# Patient Record
Sex: Male | Born: 1960 | Race: Black or African American | Hispanic: No | Marital: Single | Smoking: Never smoker
Health system: Southern US, Community
[De-identification: ages and names within clinical notes are randomized; demographics above are authoritative.]

## PROBLEM LIST (undated history)

## (undated) DIAGNOSIS — Z21 Asymptomatic human immunodeficiency virus [HIV] infection status: Secondary | ICD-10-CM

## (undated) DIAGNOSIS — B20 Human immunodeficiency virus [HIV] disease: Secondary | ICD-10-CM

## (undated) DIAGNOSIS — G4733 Obstructive sleep apnea (adult) (pediatric): Secondary | ICD-10-CM

## (undated) DIAGNOSIS — E119 Type 2 diabetes mellitus without complications: Secondary | ICD-10-CM

## (undated) DIAGNOSIS — I10 Essential (primary) hypertension: Secondary | ICD-10-CM

---

## 2011-09-15 DIAGNOSIS — G4700 Insomnia, unspecified: Secondary | ICD-10-CM

## 2011-09-15 HISTORY — DX: Insomnia, unspecified: G47.00

## 2013-03-25 DIAGNOSIS — G894 Chronic pain syndrome: Secondary | ICD-10-CM

## 2013-03-25 DIAGNOSIS — R809 Proteinuria, unspecified: Secondary | ICD-10-CM

## 2013-03-25 HISTORY — DX: Proteinuria, unspecified: R80.9

## 2014-08-29 ENCOUNTER — Emergency Department (HOSPITAL_BASED_OUTPATIENT_CLINIC_OR_DEPARTMENT_OTHER): Payer: Medicare Other

## 2014-08-29 ENCOUNTER — Emergency Department (HOSPITAL_BASED_OUTPATIENT_CLINIC_OR_DEPARTMENT_OTHER)
Admission: EM | Admit: 2014-08-29 | Discharge: 2014-08-29 | Disposition: A | Discharge status: Home / Self Care | Payer: Medicare Other | Attending: Emergency Medicine | Admitting: Emergency Medicine

## 2014-08-29 ENCOUNTER — Encounter (HOSPITAL_BASED_OUTPATIENT_CLINIC_OR_DEPARTMENT_OTHER): Payer: Self-pay | Admitting: *Deleted

## 2014-08-29 DIAGNOSIS — Z7951 Long term (current) use of inhaled steroids: Secondary | ICD-10-CM | POA: Diagnosis not present

## 2014-08-29 DIAGNOSIS — E119 Type 2 diabetes mellitus without complications: Secondary | ICD-10-CM | POA: Insufficient documentation

## 2014-08-29 DIAGNOSIS — Z79899 Other long term (current) drug therapy: Secondary | ICD-10-CM | POA: Diagnosis not present

## 2014-08-29 DIAGNOSIS — I10 Essential (primary) hypertension: Secondary | ICD-10-CM | POA: Diagnosis not present

## 2014-08-29 DIAGNOSIS — Z794 Long term (current) use of insulin: Secondary | ICD-10-CM | POA: Insufficient documentation

## 2014-08-29 DIAGNOSIS — J45901 Unspecified asthma with (acute) exacerbation: Secondary | ICD-10-CM | POA: Insufficient documentation

## 2014-08-29 DIAGNOSIS — B20 Human immunodeficiency virus [HIV] disease: Secondary | ICD-10-CM | POA: Diagnosis not present

## 2014-08-29 DIAGNOSIS — J40 Bronchitis, not specified as acute or chronic: Secondary | ICD-10-CM

## 2014-08-29 DIAGNOSIS — R05 Cough: Secondary | ICD-10-CM | POA: Diagnosis present

## 2014-08-29 HISTORY — DX: Human immunodeficiency virus (HIV) disease: B20

## 2014-08-29 HISTORY — DX: Asymptomatic human immunodeficiency virus (hiv) infection status: Z21

## 2014-08-29 HISTORY — DX: Type 2 diabetes mellitus without complications: E11.9

## 2014-08-29 HISTORY — DX: Essential (primary) hypertension: I10

## 2014-08-29 MED ORDER — HYDROCODONE-HOMATROPINE 5-1.5 MG/5ML PO SYRP: 5.0000 mL | ORAL_SOLUTION | Freq: Four times a day (QID) | ORAL | Status: DC | PRN

## 2014-08-29 MED ORDER — ALBUTEROL SULFATE (2.5 MG/3ML) 0.083% IN NEBU
2.5000 mg | INHALATION_SOLUTION | Freq: Once | RESPIRATORY_TRACT | Status: AC
  Administered 2014-08-29: 2.5 mg via RESPIRATORY_TRACT
  Filled 2014-08-29: qty 3

## 2014-08-29 MED ORDER — PREDNISONE 20 MG PO TABS: ORAL_TABLET | ORAL | Status: DC

## 2014-08-29 MED ORDER — IPRATROPIUM-ALBUTEROL 0.5-2.5 (3) MG/3ML IN SOLN
3.0000 mL | Freq: Once | RESPIRATORY_TRACT | Status: AC
  Administered 2014-08-29: 3 mL via RESPIRATORY_TRACT
  Filled 2014-08-29: qty 3

## 2014-08-29 NOTE — ED Provider Notes (Signed)
CSN: [id number]     Arrival date & time 08/29/14  0716 History   First MD Initiated Contact with Patient 08/29/14 [phone]     Chief Complaint  Patient presents with  . Cough     (Consider location/radiation/quality/duration/timing/severity/associated sxs/prior Treatment) HPI Comments: Patient presents with cough and cold symptoms. He has an underlying history of asthma. He has a 4 to five-day history of runny nose nasal congestion and cough. His cough is productive of yellow-brown sputum. He has pain in his chest from coughing. He has some increased shortness of breath and wheezing which is consistent with an asthma exacerbation. He denies any leg pain or swelling. He does have some subjective fevers. He denies any vomiting or diarrhea. He's been using his nebulizer treatments at home with some improvement of symptoms.  Patient is a 54 y.o. male presenting with cough.  Cough Associated symptoms: chest pain (with coughing), fever, myalgias, rhinorrhea, shortness of breath and wheezing   Associated symptoms: no chills, no diaphoresis, no headaches and no rash     Past Medical History  Diagnosis Date  . Hypertension   . Diabetes mellitus without complication   . HIV (human immunodeficiency virus infection)    History reviewed. No pertinent past surgical history. History reviewed. No pertinent family history. History  Substance Use Topics  . Smoking status: Never Smoker   . Smokeless tobacco: Not on file  . Alcohol Use: Not on file    Review of Systems  Constitutional: Positive for fever and fatigue. Negative for chills and diaphoresis.  HENT: Positive for congestion, postnasal drip and rhinorrhea. Negative for sneezing.   Eyes: Negative.   Respiratory: Positive for cough, shortness of breath and wheezing. Negative for chest tightness.   Cardiovascular: Positive for chest pain (with coughing). Negative for leg swelling.  Gastrointestinal: Negative for nausea, vomiting, abdominal pain,  diarrhea and blood in stool.  Genitourinary: Negative for frequency, hematuria, flank pain and difficulty urinating.  Musculoskeletal: Positive for myalgias. Negative for back pain and arthralgias.  Skin: Negative for rash.  Neurological: Negative for dizziness, speech difficulty, weakness, numbness and headaches.      Allergies  Review of patient's allergies indicates no known allergies.  Home Medications   Prior to Admission medications   Medication Sig Start Date End Date Taking? Authorizing Provider  albuterol (PROVENTIL HFA;VENTOLIN HFA) 108 (90 BASE) MCG/ACT inhaler Inhale into the lungs every 6 (six) hours as needed for wheezing or shortness of breath.   Yes Historical Provider, MD  albuterol (PROVENTIL) (5 MG/ML) 0.5% nebulizer solution Take 2.5 mg by nebulization every 6 (six) hours as needed for wheezing or shortness of breath.   Yes Historical Provider, MD  AMLODIPINE BESYLATE PO Take by mouth.   Yes Historical Provider, MD  budesonide-formoterol (SYMBICORT) 160-4.5 MCG/ACT inhaler Inhale 2 puffs into the lungs 2 (two) times daily.   Yes Historical Provider, MD  efavirenz-emtricitabine-tenofovir (ATRIPLA) 600-200-300 MG per tablet Take 1 tablet by mouth at bedtime.   Yes Historical Provider, MD  fluticasone (FLONASE) 50 MCG/ACT nasal spray Place into both nostrils daily.   Yes Historical Provider, MD  GABAPENTIN, ONCE-DAILY, PO Take by mouth.   Yes Historical Provider, MD  HYDROCHLOROTHIAZIDE PO Take by mouth.   Yes Historical Provider, MD  insulin glargine (LANTUS) 100 UNIT/ML injection Inject 62 Units into the skin at bedtime.   Yes Historical Provider, MD  Valsartan (DIOVAN PO) Take by mouth.   Yes Historical Provider, MD  HYDROcodone-homatropine (HYCODAN) 5-1.5 MG/5ML syrup Take 5 mLs  by mouth every 6 (six) hours as needed for cough. 08/29/14   [name], MD  predniSONE (DELTASONE) 20 MG tablet 3 tabs po day one, then 2 po daily x 4 days 08/29/14   [name], MD   BP  160/94 mmHg  Pulse 112  Temp(Src) 99.1 F (37.3 C) (Oral)  Resp 24  Ht  (1.727 m)  Wt 230 lb (104.327 kg)  BMI 34.98 kg/m2  SpO2 95% Physical Exam  Constitutional: He is oriented to person, place, and time. He appears well-developed and well-nourished.  HENT:  Head: Normocephalic and atraumatic.  Eyes: Pupils are equal, round, and reactive to light.  Neck: Normal range of motion. Neck supple.  Cardiovascular: Normal rate, regular rhythm and normal heart sounds.   Pulmonary/Chest: Effort normal. No respiratory distress. He has wheezes. He has no rales. He exhibits no tenderness.  Abdominal: Soft. Bowel sounds are normal. There is no tenderness. There is no rebound and no guarding.  Musculoskeletal: Normal range of motion. He exhibits no edema.  No calf tenderness  Lymphadenopathy:    He has no cervical adenopathy.  Neurological: He is alert and oriented to person, place, and time.  Skin: Skin is warm and dry. No rash noted.  Psychiatric: He has a normal mood and affect.    ED Course  Procedures (including critical care time) Labs Review Labs Reviewed - No data to display  Imaging Review Dg Chest 2 View  08/29/2014   CLINICAL DATA:  Cough and congestion  EXAM: CHEST  2 VIEW  COMPARISON:  04/13/2012  FINDINGS: The heart size and mediastinal contours are within normal limits. Both lungs are clear. The visualized skeletal structures are unremarkable. Calcified granuloma is noted in the left mid lung.  IMPRESSION: No active cardiopulmonary disease.   Electronically Signed   By: Alcide Clever M.D.   On: 08/29/2014 07:59     EKG Interpretation None      MDM   Final diagnoses:  Bronchitis    Patient presents with cough and cold symptoms. He has an underlying history of asthma. He has some mild wheezing on exam but no increased work of breathing. His oxygen saturations are normal on room air. His chest x-ray does not show pneumonia. He has no suggestions of pulmonary  embolus. He was discharged home in good condition after receiving a nebulizer treatment here in the ED. He was discharged with a prescription for prednisone dose pack. He was also given a prescription for Hycodan cough syrup. He was advised to follow-up with his primary care physician if his symptoms are not improving.    [name], MD 08/29/14 [phone]

## 2014-08-29 NOTE — Discharge Instructions (Signed)
Upper Respiratory Infection, Adult An upper respiratory infection (URI) is also sometimes known as the common cold. The upper respiratory tract includes the nose, sinuses, throat, trachea, and bronchi. Bronchi are the airways leading to the lungs. Most people improve within 1 week, but symptoms can last up to 2 weeks. A residual cough may last even longer.  CAUSES Many different viruses can infect the tissues lining the upper respiratory tract. The tissues become irritated and inflamed and often become very moist. Mucus production is also common. A cold is contagious. You can easily spread the virus to others by oral contact. This includes kissing, sharing a glass, coughing, or sneezing. Touching your mouth or nose and then touching a surface, which is then touched by another person, can also spread the virus. SYMPTOMS  Symptoms typically develop 1 to 3 days after you come in contact with a cold virus. Symptoms vary from person to person. They may include:  Runny nose.  Sneezing.  Nasal congestion.  Sinus irritation.  Sore throat.  Loss of voice (laryngitis).  Cough.  Fatigue.  Muscle aches.  Loss of appetite.  Headache.  Low-grade fever. DIAGNOSIS  You might diagnose your own cold based on familiar symptoms, since most people get a cold 2 to 3 times a year. Your caregiver can confirm this based on your exam. Most importantly, your caregiver can check that your symptoms are not due to another disease such as strep throat, sinusitis, pneumonia, asthma, or epiglottitis. Blood tests, throat tests, and X-rays are not necessary to diagnose a common cold, but they may sometimes be helpful in excluding other more serious diseases. Your caregiver will decide if any further tests are required. RISKS AND COMPLICATIONS  You may be at risk for a more severe case of the common cold if you smoke cigarettes, have chronic heart disease (such as heart failure) or lung disease (such as asthma), or if  you have a weakened immune system. The very young and very old are also at risk for more serious infections. Bacterial sinusitis, middle ear infections, and bacterial pneumonia can complicate the common cold. The common cold can worsen asthma and chronic obstructive pulmonary disease (COPD). Sometimes, these complications can require emergency medical care and may be life-threatening. PREVENTION  The best way to protect against getting a cold is to practice good hygiene. Avoid oral or hand contact with people with cold symptoms. Wash your hands often if contact occurs. There is no clear evidence that vitamin C, vitamin E, echinacea, or exercise reduces the chance of developing a cold. However, it is always recommended to get plenty of rest and practice good nutrition. TREATMENT  Treatment is directed at relieving symptoms. There is no cure. Antibiotics are not effective, because the infection is caused by a virus, not by bacteria. Treatment may include:  Increased fluid intake. Sports drinks offer valuable electrolytes, sugars, and fluids.  Breathing heated mist or steam (vaporizer or shower).  Eating chicken soup or other clear broths, and maintaining good nutrition.  Getting plenty of rest.  Using gargles or lozenges for comfort.  Controlling fevers with ibuprofen or acetaminophen as directed by your caregiver.  Increasing usage of your inhaler if you have asthma. Zinc gel and zinc lozenges, taken in the first 24 hours of the common cold, can shorten the duration and lessen the severity of symptoms. Pain medicines may help with fever, muscle aches, and throat pain. A variety of non-prescription medicines are available to treat congestion and runny nose. Your caregiver   can make recommendations and may suggest nasal or lung inhalers for other symptoms.  HOME CARE INSTRUCTIONS   Only take over-the-counter or prescription medicines for pain, discomfort, or fever as directed by your  caregiver.  Use a warm mist humidifier or inhale steam from a shower to increase air moisture. This may keep secretions moist and make it easier to breathe.  Drink enough water and fluids to keep your urine clear or pale yellow.  Rest as needed.  Return to work when your temperature has returned to normal or as your caregiver advises. You may need to stay home longer to avoid infecting others. You can also use a face mask and careful hand washing to prevent spread of the virus. SEEK MEDICAL CARE IF:   After the first few days, you feel you are getting worse rather than better.  You need your caregiver's advice about medicines to control symptoms.  You develop chills, worsening shortness of breath, or brown or red sputum. These may be signs of pneumonia.  You develop yellow or brown nasal discharge or pain in the face, especially when you bend forward. These may be signs of sinusitis.  You develop a fever, swollen neck glands, pain with swallowing, or white areas in the back of your throat. These may be signs of strep throat. SEEK IMMEDIATE MEDICAL CARE IF:   You have a fever.  You develop severe or persistent headache, ear pain, sinus pain, or chest pain.  You develop wheezing, a prolonged cough, cough up blood, or have a change in your usual mucus (if you have chronic lung disease).  You develop sore muscles or a stiff neck. Document Released: 11/16/2000 Document Revised: 08/15/2011 Document Reviewed: 08/28/2013 Exit[employer] Patient Information 2015 [employer]. This information is not intended to replace advice given to you by your health care provider. Make sure you discuss any questions you have with your health care provider.  

## 2014-08-29 NOTE — ED Notes (Signed)
Pt amb to room 2 with quick steady gait in nad. Pt reports cough producing dark brown/yellow sputum, fevers up to 100.4. Pt has not taken any otc meds, but has been using his inhaler.

## 2014-09-30 ENCOUNTER — Emergency Department (HOSPITAL_BASED_OUTPATIENT_CLINIC_OR_DEPARTMENT_OTHER)
Admission: EM | Admit: 2014-09-30 | Discharge: 2014-09-30 | Disposition: A | Discharge status: Home / Self Care | Payer: Medicare Other | Attending: Emergency Medicine | Admitting: Emergency Medicine

## 2014-09-30 ENCOUNTER — Encounter (HOSPITAL_BASED_OUTPATIENT_CLINIC_OR_DEPARTMENT_OTHER): Payer: Self-pay | Admitting: *Deleted

## 2014-09-30 DIAGNOSIS — Z7952 Long term (current) use of systemic steroids: Secondary | ICD-10-CM | POA: Diagnosis not present

## 2014-09-30 DIAGNOSIS — Z794 Long term (current) use of insulin: Secondary | ICD-10-CM | POA: Insufficient documentation

## 2014-09-30 DIAGNOSIS — E119 Type 2 diabetes mellitus without complications: Secondary | ICD-10-CM | POA: Diagnosis not present

## 2014-09-30 DIAGNOSIS — Z21 Asymptomatic human immunodeficiency virus [HIV] infection status: Secondary | ICD-10-CM | POA: Diagnosis not present

## 2014-09-30 DIAGNOSIS — I10 Essential (primary) hypertension: Secondary | ICD-10-CM | POA: Diagnosis not present

## 2014-09-30 DIAGNOSIS — Z79899 Other long term (current) drug therapy: Secondary | ICD-10-CM | POA: Insufficient documentation

## 2014-09-30 DIAGNOSIS — Z7951 Long term (current) use of inhaled steroids: Secondary | ICD-10-CM | POA: Insufficient documentation

## 2014-09-30 DIAGNOSIS — M79602 Pain in left arm: Secondary | ICD-10-CM

## 2014-09-30 DIAGNOSIS — M79622 Pain in left upper arm: Secondary | ICD-10-CM | POA: Insufficient documentation

## 2014-09-30 MED ORDER — OXYCODONE-ACETAMINOPHEN 5-325 MG PO TABS
1.0000 | ORAL_TABLET | Freq: Once | ORAL | Status: AC
  Administered 2014-09-30: 1 via ORAL
  Filled 2014-09-30: qty 1

## 2014-09-30 MED ORDER — TRAMADOL HCL 50 MG PO TABS: 50.0000 mg | ORAL_TABLET | Freq: Four times a day (QID) | ORAL | Status: AC | PRN

## 2014-09-30 NOTE — ED Notes (Signed)
Left arm pain for 2 weeks. Diabetic. Denies chest pain.

## 2014-09-30 NOTE — ED Notes (Signed)
Patient walked to room 9 and given a gown.

## 2014-09-30 NOTE — Discharge Instructions (Signed)
Pain of Unknown Etiology (Pain Without a Known Cause) °You have come to your caregiver because of pain. Pain can occur in any part of the body. Often there is not a definite cause. If your laboratory (blood or urine) work was normal and X-rays or other studies were normal, your caregiver may treat you without knowing the cause of the pain. An example of this is the headache. Most headaches are diagnosed by taking a history. This means your caregiver asks you questions about your headaches. Your caregiver determines a treatment based on your answers. Usually testing done for headaches is normal. Often testing is not done unless there is no response to medications. Regardless of where your pain is located today, you can be given medications to make you comfortable. If no physical cause of pain can be found, most cases of pain will gradually leave as suddenly as they came.  °If you have a painful condition and no reason can be found for the pain, it is important that you follow up with your caregiver. If the pain becomes worse or does not go away, it may be necessary to repeat tests and look further for a possible cause. °· Only take over-the-counter or prescription medicines for pain, discomfort, or fever as directed by your caregiver. °· For the protection of your privacy, test results cannot be given over the phone. Make sure you receive the results of your test. Ask how these results are to be obtained if you have not been informed. It is your responsibility to obtain your test results. °· You may continue all activities unless the activities cause more pain. When the pain lessens, it is important to gradually resume normal activities. Resume activities by beginning slowly and gradually increasing the intensity and duration of the activities or exercise. During periods of severe pain, bed rest may be helpful. Lie or sit in any position that is comfortable. °· Ice used for acute (sudden) conditions may be effective.  Use a large plastic bag filled with ice and wrapped in a towel. This may provide pain relief. °· See your caregiver for continued problems. Your caregiver can help or refer you for exercises or physical therapy if necessary. °If you were given medications for your condition, do not drive, operate machinery or power tools, or sign legal documents for 24 hours. Do not drink alcohol, take sleeping pills, or take other medications that may interfere with treatment. °See your caregiver immediately if you have pain that is becoming worse and not relieved by medications. °Document Released: 02/15/2001 Document Revised: 03/13/2013 Document Reviewed: 05/23/2005 °[employer]® Patient Information ©2015 [employer]. This information is not intended to replace advice given to you by your health care provider. Make sure you discuss any questions you have with your health care provider. ° °

## 2014-09-30 NOTE — ED Provider Notes (Signed)
CSN: [id number]     Arrival date & time 09/30/14  1737   This chart was scribed for Larry Core, MD by Phillis Haggis, ED Scribe. This patient was seen in room MH09/MH09 and patient care was started at 6:39 PM.     Chief Complaint  Patient presents with  . Arm Pain   Patient is a 54 y.o. male presenting with arm pain. The history is provided by the patient. No language interpreter was used.  Arm Pain    HPI Comments: Larry Dickson is a 54 y.o. male with a history of HIV and Diabetes who presents to the Emergency Department complaining of left arm pain onset two weeks ago. He states that he switched medication for his diabetes because his sugar was out of control and states that the symptoms started 2 days after switching from Metformin. He reports similar symptoms when taking the Metformin, but states that the problems were not as severe before. He reports that the pain is constant throughout the day and feels like "someone broke his arm"," like a throbbing pain. He reports that the pain is centralized to the upper left arm. He states that he has taken naproxen with his other medications to no relief. He denies fever or chills. He reports he had blood work done two weeks ago.   Past Medical History  Diagnosis Date  . Hypertension   . Diabetes mellitus without complication   . HIV (human immunodeficiency virus infection)    History reviewed. No pertinent past surgical history. No family history on file. History  Substance Use Topics  . Smoking status: Never Smoker   . Smokeless tobacco: Not on file  . Alcohol Use: No    Review of Systems  Constitutional: Negative for fever and chills.  Musculoskeletal: Positive for myalgias.  All other systems reviewed and are negative.  Allergies  Review of patient's allergies indicates no known allergies.  Home Medications   Prior to Admission medications   Medication Sig Start Date End Date Taking? Authorizing Provider  NAPROXEN PO  Take by mouth.   Yes Historical Provider, MD  SitaGLIPtin-MetFORMIN HCl (JANUMET PO) Take by mouth.   Yes Historical Provider, MD  albuterol (PROVENTIL HFA;VENTOLIN HFA) 108 (90 BASE) MCG/ACT inhaler Inhale into the lungs every 6 (six) hours as needed for wheezing or shortness of breath.    Historical Provider, MD  albuterol (PROVENTIL) (5 MG/ML) 0.5% nebulizer solution Take 2.5 mg by nebulization every 6 (six) hours as needed for wheezing or shortness of breath.    Historical Provider, MD  AMLODIPINE BESYLATE PO Take by mouth.    Historical Provider, MD  budesonide-formoterol (SYMBICORT) 160-4.5 MCG/ACT inhaler Inhale 2 puffs into the lungs 2 (two) times daily.    Historical Provider, MD  efavirenz-emtricitabine-tenofovir (ATRIPLA) 600-200-300 MG per tablet Take 1 tablet by mouth at bedtime.    Historical Provider, MD  fluticasone (FLONASE) 50 MCG/ACT nasal spray Place into both nostrils daily.    Historical Provider, MD  GABAPENTIN, ONCE-DAILY, PO Take by mouth.    Historical Provider, MD  HYDROCHLOROTHIAZIDE PO Take by mouth.    Historical Provider, MD  HYDROcodone-homatropine (HYCODAN) 5-1.5 MG/5ML syrup Take 5 mLs by mouth every 6 (six) hours as needed for cough. 08/29/14   [name], MD  insulin glargine (LANTUS) 100 UNIT/ML injection Inject 62 Units into the skin at bedtime.    Historical Provider, MD  predniSONE (DELTASONE) 20 MG tablet 3 tabs po day one, then 2 po daily x 4  days 08/29/14   Rolan BuccoMelanie Belfi, MD  Valsartan (DIOVAN PO) Take by mouth.    Historical Provider, MD   BP 145/88 mmHg  Pulse 99  Temp(Src) 98.8 F (37.1 C) (Oral)  Resp 18  Ht 5\' 8"  (1.727 m)  Wt 232 lb (105.235 kg)  BMI 35.28 kg/m2  SpO2 95%  Physical Exam  Constitutional: He appears well-developed and well-nourished.  HENT:  Head: Normocephalic and atraumatic.  Eyes: Conjunctivae are normal.  Neck: Neck supple.  Cardiovascular: Normal rate and regular rhythm.   Radial pulses intact; good capillary refill   Pulmonary/Chest: Effort normal and breath sounds normal. No respiratory distress.  Abdominal: Soft. He exhibits no distension.  Musculoskeletal: He exhibits no edema or tenderness.  Mild tenderness to left trapezius; good ROM of the arm;   Neurological: He is alert.  Skin: Skin is warm and dry.   no skin changes;  Psychiatric: He has a normal mood and affect. His behavior is normal. Thought content normal.  Nursing note and vitals reviewed.   ED Course  Procedures (including critical care time)  6:43 PM-Discussed treatment plan which includes pain medication with pt at bedside and pt agreed to plan.   Labs Review Labs Reviewed - No data to display  Imaging Review No results found.   EKG Interpretation None      MDM   Final diagnoses:  None    Patient with left arm pain. Had some wall he was on metformin but increased when Janumet was started. Nonfocal exam. Doubt clot. Will discharge home to follow-up with his doctor tomorrow as planned. Some pain medicines given. I personally performed the services described in this documentation, which was scribed in my presence. The recorded information has been reviewed and is accurate.       Larry CoreNathan Tazaria Dlugosz, MD 10/01/14 [phone]

## 2014-12-06 ENCOUNTER — Encounter (HOSPITAL_BASED_OUTPATIENT_CLINIC_OR_DEPARTMENT_OTHER): Payer: Self-pay | Admitting: *Deleted

## 2014-12-06 ENCOUNTER — Emergency Department (HOSPITAL_BASED_OUTPATIENT_CLINIC_OR_DEPARTMENT_OTHER)
Admission: EM | Admit: 2014-12-06 | Discharge: 2014-12-06 | Disposition: A | Discharge status: Home / Self Care | Payer: Medicare Other | Attending: Emergency Medicine | Admitting: Emergency Medicine

## 2014-12-06 DIAGNOSIS — Z21 Asymptomatic human immunodeficiency virus [HIV] infection status: Secondary | ICD-10-CM | POA: Insufficient documentation

## 2014-12-06 DIAGNOSIS — Z79899 Other long term (current) drug therapy: Secondary | ICD-10-CM | POA: Diagnosis not present

## 2014-12-06 DIAGNOSIS — H66002 Acute suppurative otitis media without spontaneous rupture of ear drum, left ear: Secondary | ICD-10-CM | POA: Diagnosis not present

## 2014-12-06 DIAGNOSIS — I10 Essential (primary) hypertension: Secondary | ICD-10-CM | POA: Insufficient documentation

## 2014-12-06 DIAGNOSIS — Z794 Long term (current) use of insulin: Secondary | ICD-10-CM | POA: Diagnosis not present

## 2014-12-06 DIAGNOSIS — E119 Type 2 diabetes mellitus without complications: Secondary | ICD-10-CM | POA: Insufficient documentation

## 2014-12-06 DIAGNOSIS — Z7951 Long term (current) use of inhaled steroids: Secondary | ICD-10-CM | POA: Diagnosis not present

## 2014-12-06 DIAGNOSIS — H9202 Otalgia, left ear: Secondary | ICD-10-CM | POA: Diagnosis present

## 2014-12-06 MED ORDER — AMOXICILLIN 500 MG PO CAPS
1000.0000 mg | ORAL_CAPSULE | Freq: Once | ORAL | Status: AC
  Administered 2014-12-06: 1000 mg via ORAL
  Filled 2014-12-06: qty 2

## 2014-12-06 MED ORDER — AMOXICILLIN 500 MG PO CAPS: 1000.0000 mg | ORAL_CAPSULE | Freq: Three times a day (TID) | ORAL | Status: DC

## 2014-12-06 NOTE — ED Provider Notes (Signed)
CSN: [id number]     Arrival date & time 12/06/14  0510 History   First MD Initiated Contact with Patient 12/06/14 [phone]     Chief Complaint  Patient presents with  . Ear Pain      (Consider location/radiation/quality/duration/timing/severity/associated sxs/prior Treatment) HPI  This is a 54 year old male with a history of diabetes and HIV disease. He states his HIV count is undetectable. He is compliant with his medications. He is here with a two-day history of decreased hearing, a sensation of "stuffiness" and mild pain in his left ear. He denies fever or cold symptoms. There are no specific modifying factors. There is been no drainage from his left ear.  Past Medical History  Diagnosis Date  . Hypertension   . Diabetes mellitus without complication   . HIV (human immunodeficiency virus infection)    History reviewed. No pertinent past surgical history. No family history on file. History  Substance Use Topics  . Smoking status: Never Smoker   . Smokeless tobacco: Not on file  . Alcohol Use: No    Review of Systems  All other systems reviewed and are negative.   Allergies  Review of patient's allergies indicates no known allergies.  Home Medications   Prior to Admission medications   Medication Sig Start Date End Date Taking? Authorizing Provider  albuterol (PROVENTIL HFA;VENTOLIN HFA) 108 (90 BASE) MCG/ACT inhaler Inhale into the lungs every 6 (six) hours as needed for wheezing or shortness of breath.    Historical Provider, MD  albuterol (PROVENTIL) (5 MG/ML) 0.5% nebulizer solution Take 2.5 mg by nebulization every 6 (six) hours as needed for wheezing or shortness of breath.    Historical Provider, MD  AMLODIPINE BESYLATE PO Take by mouth.    Historical Provider, MD  budesonide-formoterol (SYMBICORT) 160-4.5 MCG/ACT inhaler Inhale 2 puffs into the lungs 2 (two) times daily.    Historical Provider, MD  efavirenz-emtricitabine-tenofovir (ATRIPLA) 600-200-300 MG per tablet  Take 1 tablet by mouth at bedtime.    Historical Provider, MD  fluticasone (FLONASE) 50 MCG/ACT nasal spray Place into both nostrils daily.    Historical Provider, MD  GABAPENTIN, ONCE-DAILY, PO Take by mouth.    Historical Provider, MD  HYDROCHLOROTHIAZIDE PO Take by mouth.    Historical Provider, MD  HYDROcodone-homatropine (HYCODAN) 5-1.5 MG/5ML syrup Take 5 mLs by mouth every 6 (six) hours as needed for cough. 08/29/14   Rolan BuccoMelanie Belfi, MD  insulin glargine (LANTUS) 100 UNIT/ML injection Inject 62 Units into the skin at bedtime.    Historical Provider, MD  NAPROXEN PO Take by mouth.    Historical Provider, MD  predniSONE (DELTASONE) 20 MG tablet 3 tabs po day one, then 2 po daily x 4 days 08/29/14   Rolan BuccoMelanie Belfi, MD  SitaGLIPtin-MetFORMIN HCl (JANUMET PO) Take by mouth.    Historical Provider, MD  traMADol (ULTRAM) 50 MG tablet Take 1 tablet (50 mg total) by mouth every 6 (six) hours as needed. 09/30/14   Benjiman CoreNathan Pickering, MD  Valsartan (DIOVAN PO) Take by mouth.    Historical Provider, MD   BP 147/95 mmHg  Pulse 91  Temp(Src) 97.7 F (36.5 C) (Oral)  Resp 18  Ht 5\' 8"  (1.727 m)  Wt 230 lb (104.327 kg)  BMI 34.98 kg/m2  SpO2 98%   Physical Exam  General: Well-developed, well-nourished male in no acute distress; appearance consistent with age of record HENT: normocephalic; atraumatic; right TM normal; left TM with purulent effusion but no erythema or rupture Eyes: pupils equal, round  and reactive to light; extraocular muscles intact Neck: supple; no lymphadenopathy  Heart: regular rate and rhythm Lungs: clear to auscultation bilaterally Abdomen: soft; nondistended; nontender; no masses or hepatosplenomegaly; bowel sounds present Extremities: No deformity; full range of motion; pulses normal Neurologic: Awake, alert and oriented; motor function intact in all extremities and symmetric; no facial droop Skin: Warm and dry Psychiatric: Normal mood and affect    ED Course  Procedures  (including critical care time)   MDM    Paula Libra, MD 12/06/14 0530

## 2014-12-06 NOTE — ED Notes (Signed)
Pt c/o left ear pain and difficulty hearing x2 days.

## 2014-12-06 NOTE — ED Notes (Signed)
MD at bedside. 

## 2018-03-18 ENCOUNTER — Encounter (HOSPITAL_BASED_OUTPATIENT_CLINIC_OR_DEPARTMENT_OTHER): Payer: Self-pay | Admitting: Adult Health

## 2018-03-18 ENCOUNTER — Emergency Department (HOSPITAL_BASED_OUTPATIENT_CLINIC_OR_DEPARTMENT_OTHER): Payer: Medicare Other

## 2018-03-18 ENCOUNTER — Other Ambulatory Visit: Payer: Self-pay

## 2018-03-18 ENCOUNTER — Emergency Department (HOSPITAL_BASED_OUTPATIENT_CLINIC_OR_DEPARTMENT_OTHER)
Admission: EM | Admit: 2018-03-18 | Discharge: 2018-03-18 | Disposition: A | Discharge status: Home / Self Care | Payer: Medicare Other | Attending: Emergency Medicine | Admitting: Emergency Medicine

## 2018-03-18 DIAGNOSIS — E119 Type 2 diabetes mellitus without complications: Secondary | ICD-10-CM | POA: Diagnosis not present

## 2018-03-18 DIAGNOSIS — J189 Pneumonia, unspecified organism: Secondary | ICD-10-CM

## 2018-03-18 DIAGNOSIS — I10 Essential (primary) hypertension: Secondary | ICD-10-CM | POA: Insufficient documentation

## 2018-03-18 DIAGNOSIS — Z21 Asymptomatic human immunodeficiency virus [HIV] infection status: Secondary | ICD-10-CM | POA: Insufficient documentation

## 2018-03-18 DIAGNOSIS — Z794 Long term (current) use of insulin: Secondary | ICD-10-CM | POA: Insufficient documentation

## 2018-03-18 DIAGNOSIS — R05 Cough: Secondary | ICD-10-CM | POA: Diagnosis present

## 2018-03-18 DIAGNOSIS — E876 Hypokalemia: Secondary | ICD-10-CM | POA: Diagnosis not present

## 2018-03-18 DIAGNOSIS — Z79899 Other long term (current) drug therapy: Secondary | ICD-10-CM | POA: Insufficient documentation

## 2018-03-18 LAB — CBC WITH DIFFERENTIAL/PLATELET
Abs Immature Granulocytes: 0.06 10*3/uL (ref 0.00–0.07)
BASOS ABS: 0 10*3/uL (ref 0.0–0.1)
Basophils Relative: 0 %
EOS ABS: 0.6 10*3/uL — AB (ref 0.0–0.5)
Eosinophils Relative: 4 %
HEMATOCRIT: 50.1 % (ref 39.0–52.0)
Hemoglobin: 16.2 g/dL (ref 13.0–17.0)
IMMATURE GRANULOCYTES: 0 %
LYMPHS ABS: 3.3 10*3/uL (ref 0.7–4.0)
Lymphocytes Relative: 24 %
MCH: 28.8 pg (ref 26.0–34.0)
MCHC: 32.3 g/dL (ref 30.0–36.0)
MCV: 89 fL (ref 80.0–100.0)
Monocytes Absolute: 1 10*3/uL (ref 0.1–1.0)
Monocytes Relative: 7 %
NEUTROS PCT: 65 %
NRBC: 0 % (ref 0.0–0.2)
Neutro Abs: 8.8 10*3/uL — ABNORMAL HIGH (ref 1.7–7.7)
PLATELETS: 292 10*3/uL (ref 150–400)
RBC: 5.63 MIL/uL (ref 4.22–5.81)
RDW: 12.8 % (ref 11.5–15.5)
WBC: 13.7 10*3/uL — AB (ref 4.0–10.5)

## 2018-03-18 LAB — BASIC METABOLIC PANEL
ANION GAP: 11 (ref 5–15)
BUN: 10 mg/dL (ref 6–20)
CO2: 29 mmol/L (ref 22–32)
Calcium: 9.5 mg/dL (ref 8.9–10.3)
Chloride: 96 mmol/L — ABNORMAL LOW (ref 98–111)
Creatinine, Ser: 1.12 mg/dL (ref 0.61–1.24)
GFR calc Af Amer: >60 mL/min (ref >60)
GFR calc non Af Amer: >60 mL/min (ref >60)
Glucose, Bld: 118 mg/dL — ABNORMAL HIGH (ref 70–99)
Potassium: 3.2 mmol/L — ABNORMAL LOW (ref 3.5–5.1)
SODIUM: 136 mmol/L (ref 135–145)

## 2018-03-18 LAB — CBG MONITORING, ED: Glucose-Capillary: 155 mg/dL — ABNORMAL HIGH (ref 70–99)

## 2018-03-18 MED ORDER — POTASSIUM CHLORIDE CRYS ER 20 MEQ PO TBCR
40.0000 meq | EXTENDED_RELEASE_TABLET | Freq: Once | ORAL | Status: AC
Start: 2018-03-18 — End: 2018-03-18
  Administered 2018-03-18: 40 meq via ORAL
  Filled 2018-03-18: qty 2

## 2018-03-18 MED ORDER — LEVOFLOXACIN 750 MG PO TABS: 750.0000 mg | ORAL_TABLET | Freq: Every day | ORAL | 0 refills | Status: DC

## 2018-03-18 MED ORDER — LEVOFLOXACIN 750 MG PO TABS
750.0000 mg | ORAL_TABLET | Freq: Once | ORAL | Status: AC
  Administered 2018-03-18: 750 mg via ORAL
  Filled 2018-03-18: qty 1

## 2018-03-18 NOTE — ED Triage Notes (Signed)
Presents with cough, congestion for the past 3 weeks. He was treated for PNA two weeks ago. HE denies fevers.

## 2018-03-18 NOTE — ED Notes (Signed)
C/o cough, congestion, cp w cough x 3 weeks  Was on 5 days of antibiotics  2 weeks ago for Pneumonia

## 2018-03-18 NOTE — Discharge Instructions (Signed)
See your primary care physician if not feeling better by next week.  Return if concern for any reason

## 2018-03-18 NOTE — ED Provider Notes (Addendum)
[facility] EMERGENCY DEPARTMENT Provider Note   CSN: [id number] Arrival date & time: 03/18/18  1451     History   Chief Complaint Chief Complaint  Patient presents with  . Cough    HPI Larry Dickson is a 57 y.o. male.  Signs of cough and nasal congestion for 5 weeks.  No fever.  He saw his primary care physician few weeks ago was diagnosed with "a touch of pneumonia" prescribed an antibiotic for 5 days which he took without relief.  No other associated symptoms nothing makes symptoms better or worse.  He has cough productive of yellow sputum and anterior chest pain with cough only.  Nothing makes symptoms better or worse.  No other associated symptoms She did not take his blood pressure medicine this morning HPI  Past Medical History:  Diagnosis Date  . Diabetes mellitus without complication (HCC)   . HIV (human immunodeficiency virus infection) (HCC)   . Hypertension    HIV, viral load undetectable There are no active problems to display for this patient.   History reviewed. No pertinent surgical history.      Home Medications    Prior to Admission medications   Medication Sig Start Date End Date Taking? Authorizing Provider  albuterol (PROVENTIL HFA;VENTOLIN HFA) 108 (90 BASE) MCG/ACT inhaler Inhale into the lungs every 6 (six) hours as needed for wheezing or shortness of breath.    [provider]  albuterol (PROVENTIL) (5 MG/ML) 0.5% nebulizer solution Take 2.5 mg by nebulization every 6 (six) hours as needed for wheezing or shortness of breath.    [provider]  AMLODIPINE BESYLATE PO Take by mouth.    [provider]  amoxicillin (AMOXIL) 500 MG capsule Take 2 capsules (1,000 mg total) by mouth 3 (three) times daily. 12/06/14   [name], [name], MD  budesonide-formoterol (SYMBICORT) 160-4.5 MCG/ACT inhaler Inhale 2 puffs into the lungs 2 (two) times daily.    [provider]  efavirenz-emtricitabine-tenofovir (ATRIPLA)  600-200-300 MG per tablet Take 1 tablet by mouth at bedtime.    [provider]  fluticasone (FLONASE) 50 MCG/ACT nasal spray Place into both nostrils daily.    [provider]  GABAPENTIN, ONCE-DAILY, PO Take by mouth.    [provider]  HYDROCHLOROTHIAZIDE PO Take by mouth.    [provider]  HYDROcodone-homatropine (HYCODAN) 5-1.5 MG/5ML syrup Take 5 mLs by mouth every 6 (six) hours as needed for cough. 08/29/14   [name], MD  insulin glargine (LANTUS) 100 UNIT/ML injection Inject 62 Units into the skin at bedtime.    [provider]  NAPROXEN PO Take by mouth.    [provider]  predniSONE (DELTASONE) 20 MG tablet 3 tabs po day one, then 2 po daily x 4 days 08/29/14   [name], MD  SitaGLIPtin-MetFORMIN HCl (JANUMET PO) Take by mouth.    [provider]  traMADol (ULTRAM) 50 MG tablet Take 1 tablet (50 mg total) by mouth every 6 (six) hours as needed. 09/30/14   [name], MD  Valsartan (DIOVAN PO) Take by mouth.    [provider]    Family History History reviewed. No pertinent family history.  Social History Social History   Tobacco Use  . Smoking status: Never Smoker  Substance Use Topics  . Alcohol use: No  . Drug use: No     Allergies   Patient has no known allergies.   Review of Systems Review of Systems  Constitutional: Negative.  HENT: Positive for congestion.   Respiratory: Positive for cough.   Cardiovascular: Positive for chest pain.  Gastrointestinal: Negative.   Musculoskeletal: Negative.   Skin: Negative.   Allergic/Immunologic: Positive for immunocompromised state.       Diabetic  Neurological: Negative.   Psychiatric/Behavioral: Negative.   All other systems reviewed and are negative.    Physical Exam Updated Vital Signs BP (!) 150/104 (BP Location: Right Arm) Comment: hx of htn  Pulse 96   Temp 98.6 F (37 C) (Oral)   Resp 18   Ht 5\' 8"  (1.727 m)    Wt 104.3 kg   SpO2 95%   BMI 34.97 kg/m   Physical Exam  Constitutional: He appears well-developed and well-nourished.  HENT:  Head: Normocephalic and atraumatic.  Right Ear: External ear normal.  Left Ear: External ear normal.  Mouth/Throat: Oropharynx is clear and moist.  biLateral tympanic membranes normal  Eyes: Pupils are equal, round, and reactive to light. Conjunctivae are normal.  Neck: Neck supple. No tracheal deviation present. No thyromegaly present.  Cardiovascular: Normal rate and regular rhythm.  No murmur heard. Pulmonary/Chest: Effort normal and breath sounds normal.  Abdominal: Soft. Bowel sounds are normal. He exhibits no distension. There is no tenderness.  Musculoskeletal: Normal range of motion. He exhibits no edema or tenderness.  Neurological: He is alert. Coordination normal.  Skin: Skin is warm and dry. No rash noted.  Psychiatric: He has a normal mood and affect.  Nursing note and vitals reviewed.    ED Treatments / Results  Labs (all labs ordered are listed, but only abnormal results are displayed) Labs Reviewed  CBG MONITORING, ED    EKG EKG Interpretation  Date/Time:  Sunday March 18 2018 15:10:52 EDT Ventricular Rate:  93 PR Interval:  170 QRS Duration: 74 QT Interval:  370 QTC Calculation: 460 R Axis:   0 Text Interpretation:  Normal sinus rhythm Normal ECG No significant change since last tracing Confirmed by Doug Sou [phone]) on 03/18/2018 3:31:22 PM   Radiology No results found.  Procedures Procedures (including critical care time)  Medications Ordered in ED Medications - No data to display  Results for orders placed or performed during the hospital encounter of 03/18/18  Basic metabolic panel  Result Value Ref Range   Sodium 136 135 - 145 mmol/L   Potassium 3.2 (L) 3.5 - 5.1 mmol/L   Chloride 96 (L) 98 - 111 mmol/L   CO2 29 22 - 32 mmol/L   Glucose, Bld 118 (H) 70 - 99 mg/dL   BUN 10 6 - 20 mg/dL    Creatinine, Ser 6.04 0.61 - 1.24 mg/dL   Calcium 9.5 8.9 - 54.0 mg/dL   GFR calc non Af Amer >60 >60 mL/min   GFR calc Af Amer >60 >60 mL/min   Anion gap 11 5 - 15  CBC with Differential/Platelet  Result Value Ref Range   WBC 13.7 (H) 4.0 - 10.5 K/uL   RBC 5.63 4.22 - 5.81 MIL/uL   Hemoglobin 16.2 13.0 - 17.0 g/dL   HCT 98.1 19.1 - 47.8 %   MCV 89.0 80.0 - 100.0 fL   MCH 28.8 26.0 - 34.0 pg   MCHC 32.3 30.0 - 36.0 g/dL   RDW 29.5 62.1 - 30.8 %   Platelets 292 150 - 400 K/uL   nRBC 0.0 0.0 - 0.2 %   Neutrophils Relative % 65 %   Neutro Abs 8.8 (H) 1.7 - 7.7 K/uL   Lymphocytes Relative 24 %  Lymphs Abs 3.3 0.7 - 4.0 K/uL   Monocytes Relative 7 %   Monocytes Absolute 1.0 0.1 - 1.0 K/uL   Eosinophils Relative 4 %   Eosinophils Absolute 0.6 (H) 0.0 - 0.5 K/uL   Basophils Relative 0 %   Basophils Absolute 0.0 0.0 - 0.1 K/uL   Immature Granulocytes 0 %   Abs Immature Granulocytes 0.06 0.00 - 0.07 K/uL  CBG monitoring, ED  Result Value Ref Range   Glucose-Capillary 155 (H) 70 - 99 mg/dL   Dg Chest 2 View  Result Date: 03/18/2018 CLINICAL DATA:  57 year old with cough and congestion. EXAM: CHEST - 2 VIEW COMPARISON:  06/05/2017 and 08/29/2014 FINDINGS: Slightly increased densities along the anterior lower chest on the lateral view. This appears new from the prior examinations. Not clear if these anterior densities are in the left or right lung based on the PA view. Heart size is stable and within normal limits. Trachea is midline. No large pleural effusions. No acute bone abnormality. IMPRESSION: Subtle new densities in the anterior lung possibly in the right middle lobe or lingular region. Differential would include atelectasis versus a small focus of infection. Electronically Signed   By: Richarda Overlie M.D.   On: 03/18/2018 16:07   Initial Impression / Assessment and Plan / ED Course  I have reviewed the triage vital signs and the nursing notes.  Pertinent labs & imaging results that  were available during my care of the patient were reviewed by me and considered in my medical decision making (see chart for details).     Chest x-ray reviewed by me lab work consistent with mild hypokalemia and leukocytosis.  5:40 PM patient resting comfortably.  Patient is candidate for outpatient treatment commune acquired pneumonia.  Port score equals 58.  He received Levaquin 750 mg orally here as well as potassium chloride 40 mg p.o.  Will write prescription for Levaquin 750 mill grams daily for 7 days starting tomorrow. Final Clinical Impressions(s) / ED Diagnoses  Diagnosis#1 community acquired pneumonia #2 hypokalemia Final diagnoses:  None    ED Discharge Orders    None       Doug Sou, MD 03/18/18 1749    Doug Sou, MD 03/18/18 1751

## 2018-03-27 ENCOUNTER — Emergency Department (HOSPITAL_BASED_OUTPATIENT_CLINIC_OR_DEPARTMENT_OTHER)
Admission: EM | Admit: 2018-03-27 | Discharge: 2018-03-27 | Disposition: A | Discharge status: Home / Self Care | Payer: Medicare Other | Attending: Emergency Medicine | Admitting: Emergency Medicine

## 2018-03-27 ENCOUNTER — Encounter (HOSPITAL_BASED_OUTPATIENT_CLINIC_OR_DEPARTMENT_OTHER): Payer: Self-pay | Admitting: Emergency Medicine

## 2018-03-27 ENCOUNTER — Other Ambulatory Visit: Payer: Self-pay

## 2018-03-27 DIAGNOSIS — B20 Human immunodeficiency virus [HIV] disease: Secondary | ICD-10-CM | POA: Diagnosis not present

## 2018-03-27 DIAGNOSIS — Y92008 Other place in unspecified non-institutional (private) residence as the place of occurrence of the external cause: Secondary | ICD-10-CM | POA: Diagnosis not present

## 2018-03-27 DIAGNOSIS — Y939 Activity, unspecified: Secondary | ICD-10-CM | POA: Insufficient documentation

## 2018-03-27 DIAGNOSIS — E119 Type 2 diabetes mellitus without complications: Secondary | ICD-10-CM | POA: Diagnosis not present

## 2018-03-27 DIAGNOSIS — S1096XA Insect bite of unspecified part of neck, initial encounter: Secondary | ICD-10-CM | POA: Insufficient documentation

## 2018-03-27 DIAGNOSIS — I10 Essential (primary) hypertension: Secondary | ICD-10-CM

## 2018-03-27 DIAGNOSIS — W57XXXA Bitten or stung by nonvenomous insect and other nonvenomous arthropods, initial encounter: Secondary | ICD-10-CM | POA: Insufficient documentation

## 2018-03-27 DIAGNOSIS — Z79899 Other long term (current) drug therapy: Secondary | ICD-10-CM | POA: Diagnosis not present

## 2018-03-27 DIAGNOSIS — Z794 Long term (current) use of insulin: Secondary | ICD-10-CM | POA: Diagnosis not present

## 2018-03-27 DIAGNOSIS — Y999 Unspecified external cause status: Secondary | ICD-10-CM | POA: Diagnosis not present

## 2018-03-27 MED ORDER — DIPHENHYDRAMINE HCL 25 MG PO CAPS
25.0000 mg | ORAL_CAPSULE | Freq: Once | ORAL | Status: AC
  Administered 2018-03-27: 25 mg via ORAL
  Filled 2018-03-27: qty 1

## 2018-03-27 NOTE — Discharge Instructions (Addendum)
Please see the information and instructions below regarding your visit.  Your diagnoses today include:  1. Insect bite of neck, initial encounter   2. Elevated blood pressure reading with diagnosis of hypertension     Tests performed today include: See side panel of your discharge paperwork for testing performed today. Vital signs are listed at the bottom of these instructions.   Medications prescribed:    Take any prescribed medications only as prescribed, and any over the counter medications only as directed on the packaging.  Please take Benadryl, 25 mg every 6 hours for the next 24 hours as needed for irritation of the left neck.  Do not drive, drink alcohol, or operate machinery while taking this medication, as it can make you sleepy.  Home care instructions:  Please follow any educational materials contained in this packet.   Apply ice packs 20 minutes at a time to the insect bite.  Follow-up instructions: Please follow-up with your primary care provider as needed for further evaluation of your symptoms if they are not completely improved.   Return instructions:  Please return to the Emergency Department if you experience worsening symptoms.  Please return to the emergency department if you develop any expanding sites of redness, weakness or numbness in extremities, changes in vision, worsening headache, worsening joint pain, or fever or chills. Please return if you have any other emergent concerns.  Additional Information:   Your vital signs today were: BP (!) 157/96 (BP Location: Right Arm)    Pulse 96    Temp 98.5 F (36.9 C) (Oral)    Resp 18    Ht 5\' 8"  (1.727 m)    Wt 104.3 kg    SpO2 96%    BMI 34.97 kg/m  If your blood pressure (BP) was elevated on multiple readings during this visit above 130 for the top number or above 80 for the bottom number, please have this repeated by your primary care provider within one month. --------------  Thank you for allowing [place] to  participate in your care today.

## 2018-03-27 NOTE — ED Provider Notes (Signed)
[facility] EMERGENCY DEPARTMENT Provider Note   CSN: [id number] Arrival date & time: 03/27/18  2050     History   Chief Complaint Chief Complaint  Patient presents with  . Insect Bite    HPI Larry Dickson is a 57 y.o. male.  HPI  Patient is a 57 year old male with a history of diabetes mellitus, HIV, hypertension presenting for insect bite to the left posterior neck, and development of left-sided headache, and left-sided joint pain affecting the left shoulder and left hip.  Patient reports this occurred around 6 PM today.  He did not see the insect, however he believes it was a spider because there are many spiders on the outside deck where he was outside.  Patient reports some mild swelling to the site, but otherwise it has not spread since the incident.  Patient denies any visual disturbance, neck stiffness, weakness or numbness in extremities, difficulty speaking, difficulty ambulating.  Patient denies any swollen or erythematous joints.  Past Medical History:  Diagnosis Date  . Diabetes mellitus without complication (HCC)   . HIV (human immunodeficiency virus infection) (HCC)   . Hypertension     There are no active problems to display for this patient.   History reviewed. No pertinent surgical history.      Home Medications    Prior to Admission medications   Medication Sig Start Date End Date Taking? Authorizing Provider  albuterol (PROVENTIL HFA;VENTOLIN HFA) 108 (90 BASE) MCG/ACT inhaler Inhale into the lungs every 6 (six) hours as needed for wheezing or shortness of breath.    [provider]  albuterol (PROVENTIL) (5 MG/ML) 0.5% nebulizer solution Take 2.5 mg by nebulization every 6 (six) hours as needed for wheezing or shortness of breath.    [provider]  AMLODIPINE BESYLATE PO Take by mouth.    [provider]  amoxicillin (AMOXIL) 500 MG capsule Take 2 capsules (1,000 mg total) by mouth 3 (three) times daily.  12/06/14   [name], [name], MD  budesonide-formoterol (SYMBICORT) 160-4.5 MCG/ACT inhaler Inhale 2 puffs into the lungs 2 (two) times daily.    [provider]  efavirenz-emtricitabine-tenofovir (ATRIPLA) 600-200-300 MG per tablet Take 1 tablet by mouth at bedtime.    [provider]  fluticasone (FLONASE) 50 MCG/ACT nasal spray Place into both nostrils daily.    [provider]  GABAPENTIN, ONCE-DAILY, PO Take by mouth.    [provider]  HYDROCHLOROTHIAZIDE PO Take by mouth.    [provider]  HYDROcodone-homatropine (HYCODAN) 5-1.5 MG/5ML syrup Take 5 mLs by mouth every 6 (six) hours as needed for cough. 08/29/14   [name], MD  insulin glargine (LANTUS) 100 UNIT/ML injection Inject 62 Units into the skin at bedtime.    [provider]  levofloxacin (LEVAQUIN) 750 MG tablet Take 1 tablet (750 mg total) by mouth daily. X 7 days 03/18/18   Doug Sou, MD  NAPROXEN PO Take by mouth.    [provider]  predniSONE (DELTASONE) 20 MG tablet 3 tabs po day one, then 2 po daily x 4 days 08/29/14   [name], MD  SitaGLIPtin-MetFORMIN HCl (JANUMET PO) Take by mouth.    [provider]  traMADol (ULTRAM) 50 MG tablet Take 1 tablet (50 mg total) by mouth every 6 (six) hours as needed. 09/30/14   [name], MD  Valsartan (DIOVAN PO) Take by mouth.    [provider]    Family History No family history on file.  Social  History Social History   Tobacco Use  . Smoking status: Never Smoker  Substance Use Topics  . Alcohol use: No  . Drug use: No     Allergies   Patient has no known allergies.   Review of Systems Review of Systems  Eyes: Negative for visual disturbance.  Musculoskeletal: Positive for arthralgias and neck pain. Negative for gait problem and neck stiffness.  Skin: Positive for color change and rash.  Neurological: Positive for headaches. Negative for weakness and numbness.  All  other systems reviewed and are negative.    Physical Exam Updated Vital Signs BP (!) 157/96 (BP Location: Right Arm)   Pulse 96   Temp 98.5 F (36.9 C) (Oral)   Resp 18   Ht 5\' 8"  (1.727 m)   Wt 104.3 kg   SpO2 96%   BMI 34.97 kg/m   Physical Exam  Constitutional: He appears well-developed and well-nourished. No distress.  Sitting comfortably in bed.  HENT:  Head: Normocephalic and atraumatic.  Eyes: Conjunctivae are normal. Right eye exhibits no discharge. Left eye exhibits no discharge.  EOMs normal to gross examination.  Neck: Normal range of motion.  Cardiovascular: Normal rate and regular rhythm.  Intact, 2+ radial pulse.  Pulmonary/Chest:  Normal respiratory effort. Patient converses comfortably. No audible wheeze or stridor.  Abdominal: He exhibits no distension.  Musculoskeletal: Normal range of motion.  Neurological: He is alert.  Mental Status:  Alert, oriented, thought content appropriate, able to give a coherent history. Speech fluent without evidence of aphasia. Able to follow 2 step commands without difficulty.  Cranial Nerves: CN s II-XII intact.  Motor:  Normal tone. 5/5 in upper and lower extremities bilaterally including strong and equal grip strength and dorsiflexion/plantar flexion Gait: normal gait and balance   Skin: Skin is warm and dry. He is not diaphoretic.  Patient has erythematous wheal on the left neck.  No fluctuance.  Psychiatric: He has a normal mood and affect. His behavior is normal. Judgment and thought content normal.  Nursing note and vitals reviewed.    ED Treatments / Results  Labs (all labs ordered are listed, but only abnormal results are displayed) Labs Reviewed - No data to display  EKG None  Radiology No results found.  Procedures Procedures (including critical care time)  Medications Ordered in ED Medications  diphenhydrAMINE (BENADRYL) capsule 25 mg (has no administration in time range)     Initial  Impression / Assessment and Plan / ED Course  I have reviewed the triage vital signs and the nursing notes.  Pertinent labs & imaging results that were available during my care of the patient were reviewed by me and considered in my medical decision making (see chart for details).  Clinical Course as of Mar 28 2223  Tue Mar 27, 2018  2224 Tdap up to date.   [AM]    Clinical Course User Index [AM] Elisha Ponder, PA-C    Patient has nontoxic-appearing, neurologically intact and in no acute distress.  Patient with possible insect bite to the left posterior neck.  I discussed with patient that based on his symptomatology, do not feel that it is consistent with an insect bite, is most "spider bites" do not cause systemic symptoms, and will not be one-sided in this manner.  When I further discussed the etiologies of the symptoms with the patient, he stated that his headache was "similar to other prior headaches, and when I suggested that he be further evaluated for headache, he  declined.  I discussed with the risks and benefits with the patient, however he stated he did not want further evaluation, and only wanted to be treated for the insect bite.  Benadryl 25 mg and ice pack ordered.  Return precautions were given for any increasing symptoms, pain, progression of headache, visual disturbance, weakness or numbness in extremities, or expanding redness of the insect site.  Patient is in understanding and agrees with the plan of care.  Final Clinical Impressions(s) / ED Diagnoses   Final diagnoses:  Insect bite of neck, initial encounter  Elevated blood pressure reading with diagnosis of hypertension    ED Discharge Orders    None       Delia Chimes 03/27/18 2246    Vanetta Mulders, MD 03/29/18 [phone]

## 2018-03-27 NOTE — ED Triage Notes (Signed)
Pt states he had a spider bite today at 6 pm now he is having HA and join pain.

## 2019-07-19 IMAGING — CR DG CHEST 2V
2 series · 2 of 2 positions shown · non-contrast
Comparison: 06/05/2017 and 08/29/2014

CLINICAL DATA: 57-year-old with cough and congestion.

EXAM:
CHEST - 2 VIEW

[w chest pa]
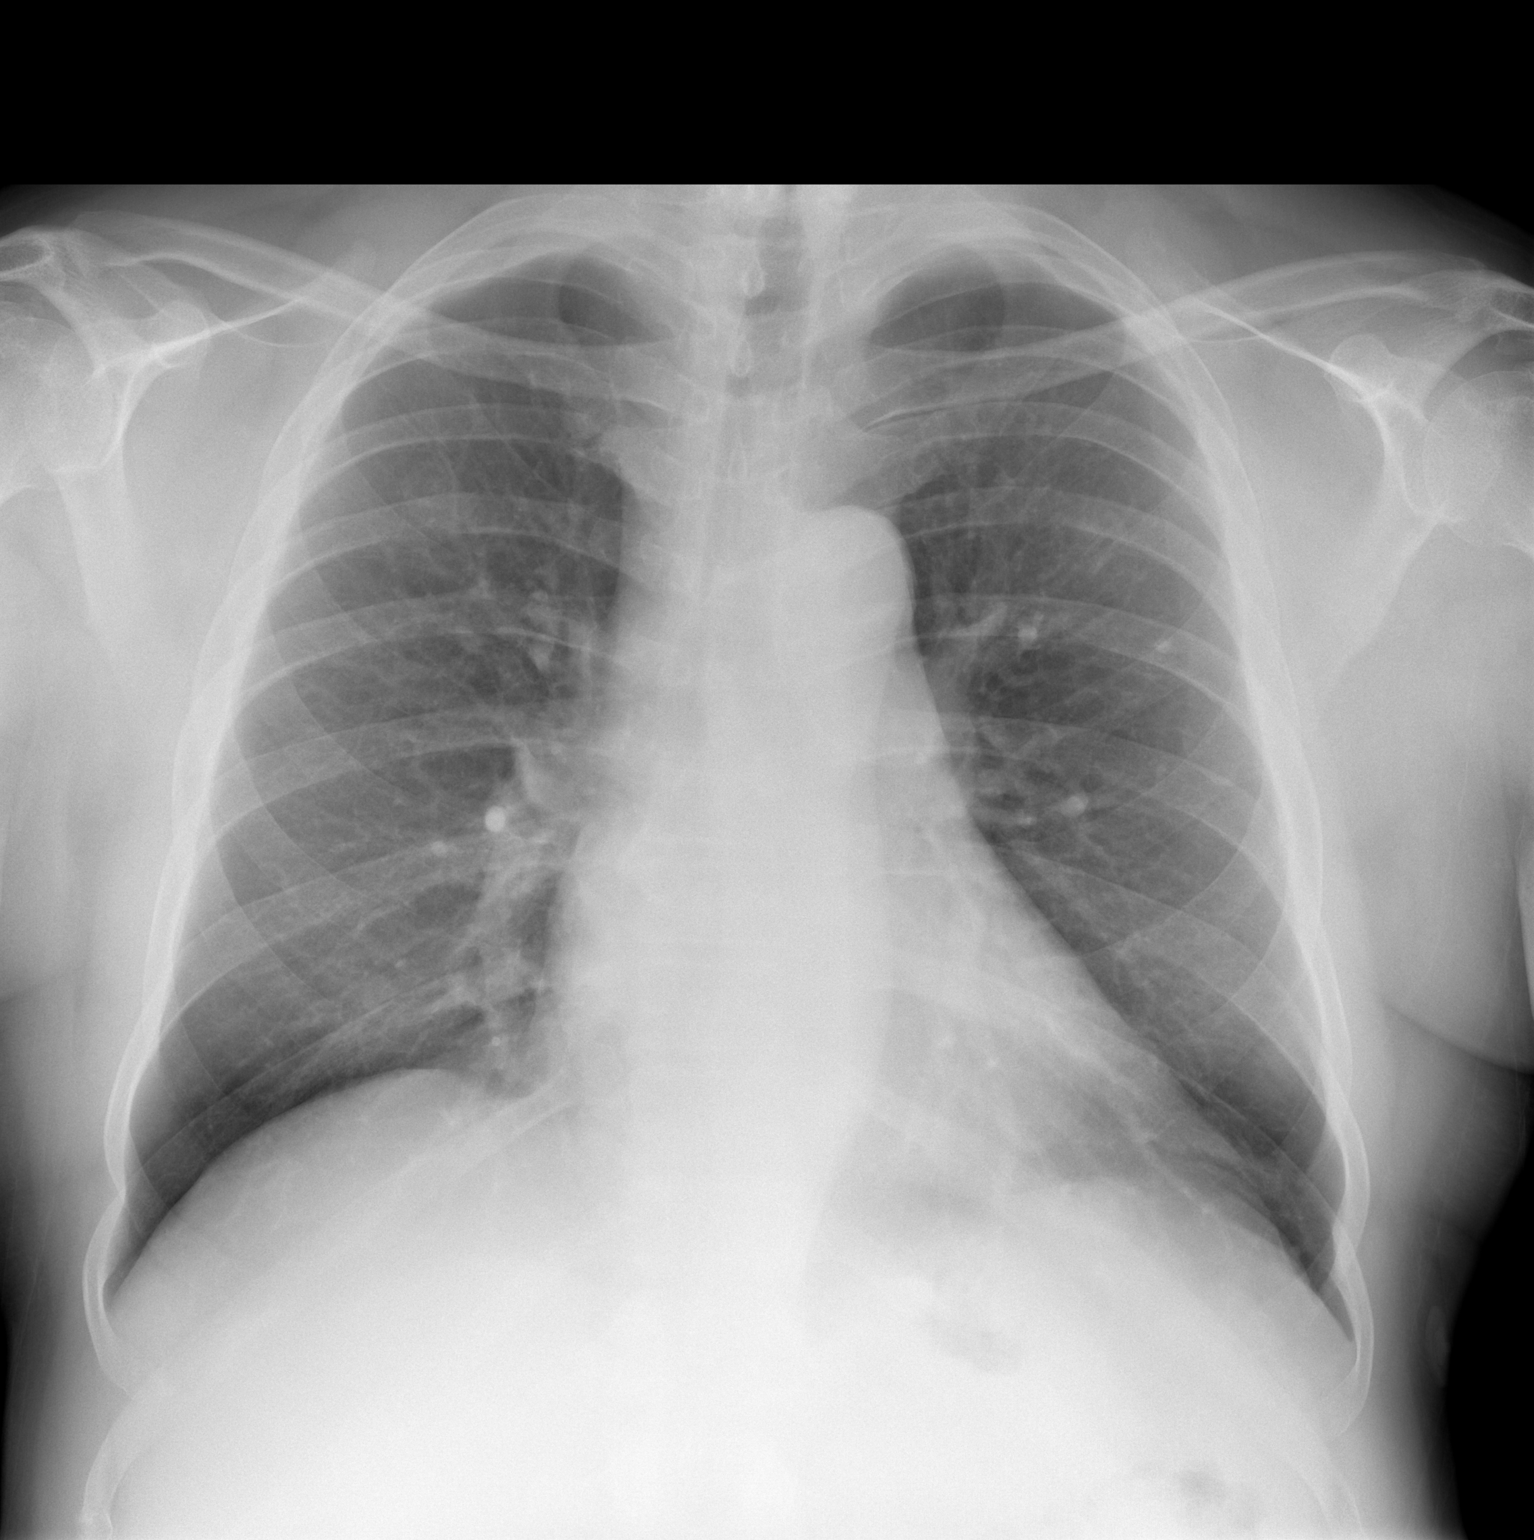

[w chest lat]
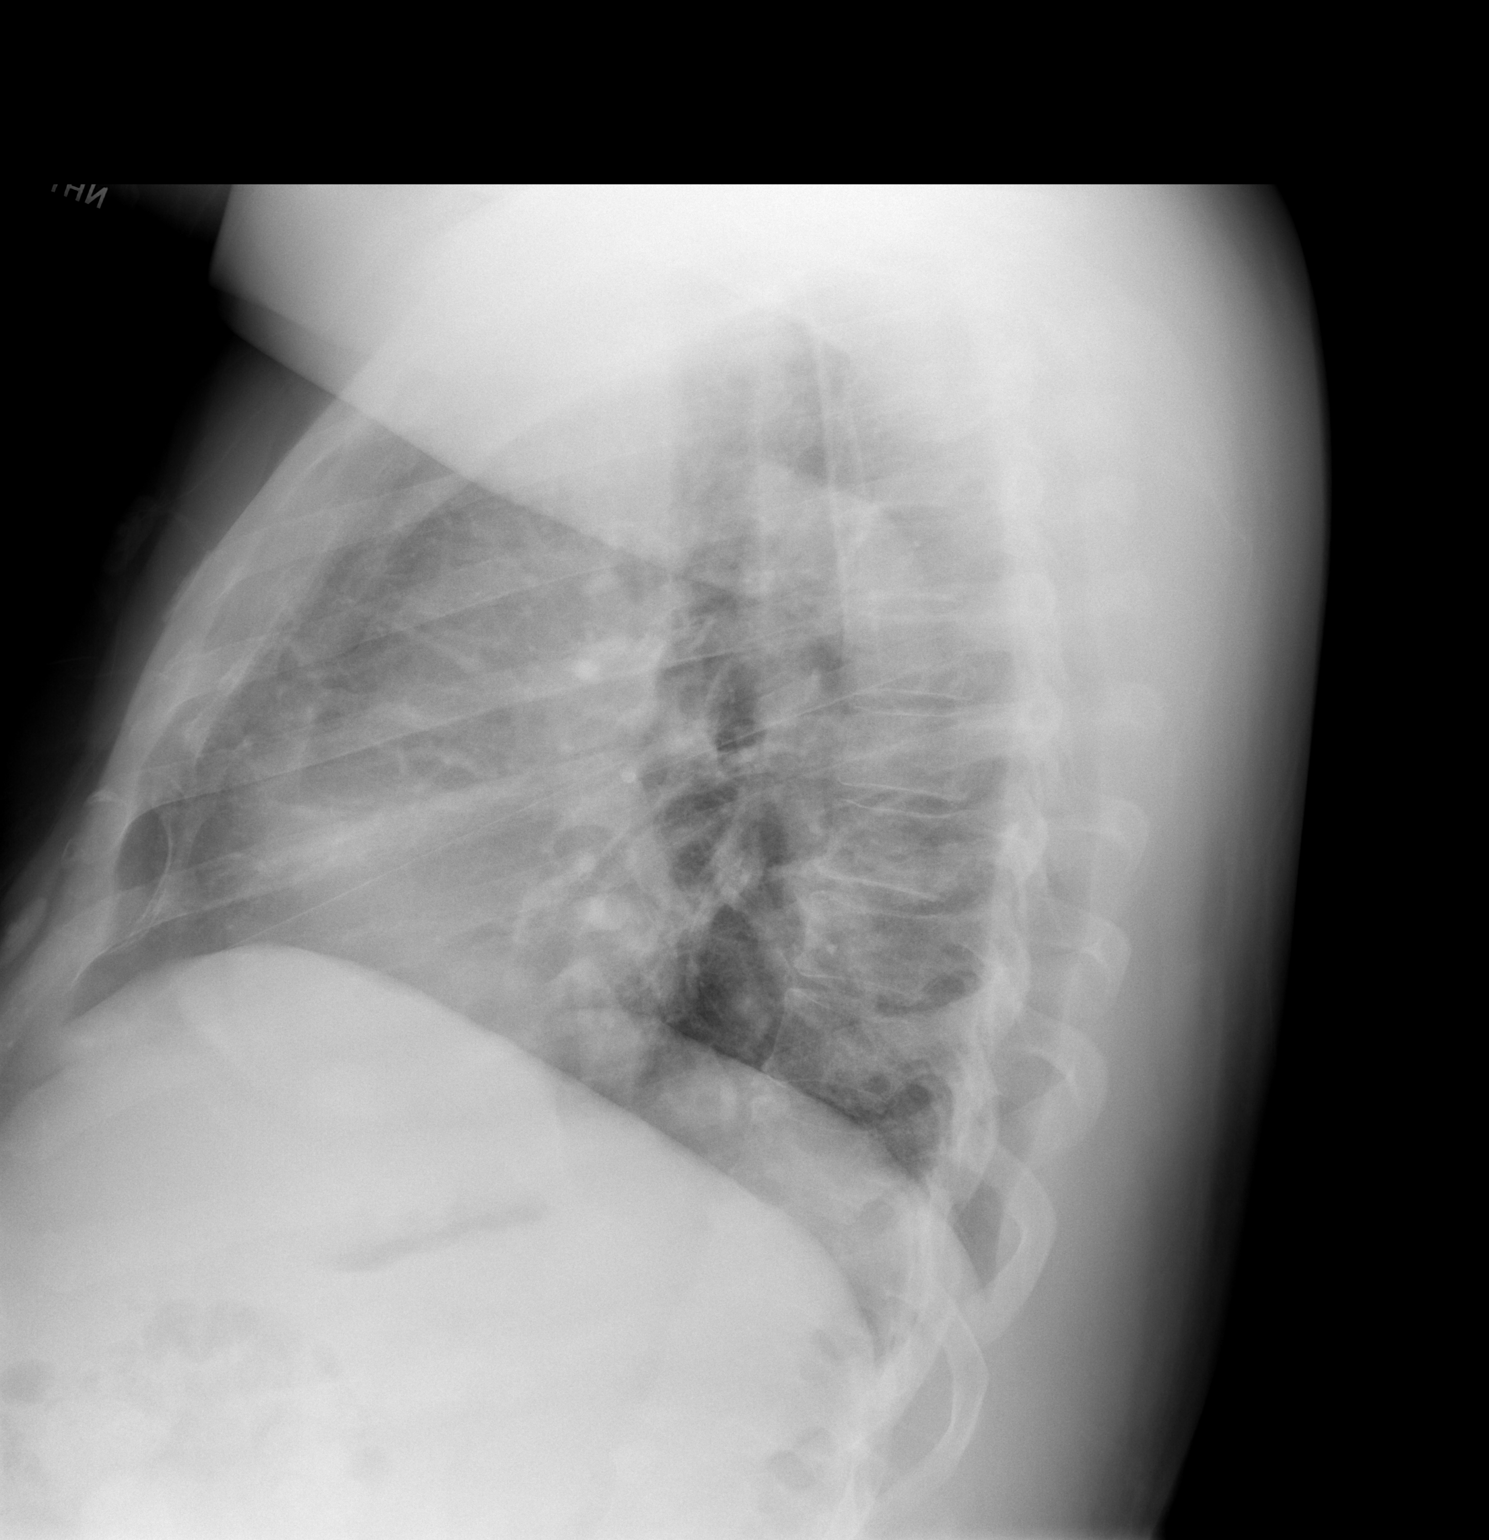

[2 of 2 positions shown; findings below may reference images not displayed]

FINDINGS: Slightly increased densities along the anterior lower chest on the
lateral view. This appears new from the prior examinations. Not
clear if these anterior densities are in the left or right lung
based on the PA view. Heart size is stable and within normal limits.
Trachea is midline. No large pleural effusions. No acute bone
abnormality.
IMPRESSION: Subtle new densities in the anterior lung possibly in the right
middle lobe or lingular region. Differential would include
atelectasis versus a small focus of infection.

## 2021-11-29 ENCOUNTER — Emergency Department (HOSPITAL_BASED_OUTPATIENT_CLINIC_OR_DEPARTMENT_OTHER)
Admission: EM | Admit: 2021-11-29 | Discharge: 2021-11-29 | Disposition: A | Discharge status: Home / Self Care | Payer: Medicare Other | Attending: Emergency Medicine | Admitting: Emergency Medicine

## 2021-11-29 ENCOUNTER — Encounter (HOSPITAL_BASED_OUTPATIENT_CLINIC_OR_DEPARTMENT_OTHER): Payer: Self-pay | Admitting: Emergency Medicine

## 2021-11-29 ENCOUNTER — Emergency Department (HOSPITAL_BASED_OUTPATIENT_CLINIC_OR_DEPARTMENT_OTHER): Payer: Medicare Other

## 2021-11-29 ENCOUNTER — Other Ambulatory Visit: Payer: Self-pay

## 2021-11-29 DIAGNOSIS — R042 Hemoptysis: Secondary | ICD-10-CM | POA: Diagnosis not present

## 2021-11-29 DIAGNOSIS — Z7951 Long term (current) use of inhaled steroids: Secondary | ICD-10-CM | POA: Insufficient documentation

## 2021-11-29 DIAGNOSIS — Z20822 Contact with and (suspected) exposure to covid-19: Secondary | ICD-10-CM | POA: Insufficient documentation

## 2021-11-29 DIAGNOSIS — J189 Pneumonia, unspecified organism: Secondary | ICD-10-CM

## 2021-11-29 DIAGNOSIS — R059 Cough, unspecified: Secondary | ICD-10-CM | POA: Diagnosis present

## 2021-11-29 HISTORY — DX: Obstructive sleep apnea (adult) (pediatric): G47.33

## 2021-11-29 LAB — RESP PANEL BY RT-PCR (FLU A&B, COVID) ARPGX2
Influenza A by PCR: NEGATIVE
Influenza B by PCR: NEGATIVE
SARS Coronavirus 2 by RT PCR: NEGATIVE

## 2021-11-29 MED ORDER — CEFTRIAXONE SODIUM 1 G IJ SOLR
1.0000 g | Freq: Once | INTRAMUSCULAR | Status: AC
  Administered 2021-11-29: 1 g via INTRAMUSCULAR
  Filled 2021-11-29: qty 10

## 2021-11-29 MED ORDER — AZITHROMYCIN 250 MG PO TABS: 250.0000 mg | ORAL_TABLET | Freq: Every day | ORAL | 0 refills | Status: DC

## 2021-11-29 MED ORDER — LIDOCAINE HCL (PF) 1 % IJ SOLN
INTRAMUSCULAR | Status: AC
  Administered 2021-11-29: 5 mL
  Filled 2021-11-29: qty 5

## 2021-11-29 NOTE — ED Triage Notes (Signed)
Pt reports he started coughing yesterday and coughed up pink tinged sputum. Body aches and reports stinging in chest with coughing

## 2022-04-04 ENCOUNTER — Emergency Department (HOSPITAL_BASED_OUTPATIENT_CLINIC_OR_DEPARTMENT_OTHER): Payer: No Typology Code available for payment source

## 2022-04-04 ENCOUNTER — Encounter (HOSPITAL_BASED_OUTPATIENT_CLINIC_OR_DEPARTMENT_OTHER): Payer: Self-pay | Admitting: Emergency Medicine

## 2022-04-04 ENCOUNTER — Other Ambulatory Visit: Payer: Self-pay

## 2022-04-04 ENCOUNTER — Emergency Department (HOSPITAL_BASED_OUTPATIENT_CLINIC_OR_DEPARTMENT_OTHER)
Admission: EM | Admit: 2022-04-04 | Discharge: 2022-04-04 | Disposition: A | Discharge status: Home / Self Care | Payer: No Typology Code available for payment source | Attending: Emergency Medicine | Admitting: Emergency Medicine

## 2022-04-04 DIAGNOSIS — M25512 Pain in left shoulder: Secondary | ICD-10-CM | POA: Diagnosis not present

## 2022-04-04 DIAGNOSIS — S0990XA Unspecified injury of head, initial encounter: Secondary | ICD-10-CM | POA: Insufficient documentation

## 2022-04-04 DIAGNOSIS — S161XXA Strain of muscle, fascia and tendon at neck level, initial encounter: Secondary | ICD-10-CM | POA: Insufficient documentation

## 2022-04-04 DIAGNOSIS — E119 Type 2 diabetes mellitus without complications: Secondary | ICD-10-CM | POA: Insufficient documentation

## 2022-04-04 DIAGNOSIS — I10 Essential (primary) hypertension: Secondary | ICD-10-CM | POA: Insufficient documentation

## 2022-04-04 DIAGNOSIS — S199XXA Unspecified injury of neck, initial encounter: Secondary | ICD-10-CM | POA: Diagnosis present

## 2022-04-04 DIAGNOSIS — Z21 Asymptomatic human immunodeficiency virus [HIV] infection status: Secondary | ICD-10-CM | POA: Insufficient documentation

## 2022-04-04 DIAGNOSIS — Y92410 Unspecified street and highway as the place of occurrence of the external cause: Secondary | ICD-10-CM | POA: Insufficient documentation

## 2022-04-04 DIAGNOSIS — M25511 Pain in right shoulder: Secondary | ICD-10-CM | POA: Diagnosis not present

## 2022-04-04 MED ORDER — METHOCARBAMOL 500 MG PO TABS: 500.0000 mg | ORAL_TABLET | Freq: Three times a day (TID) | ORAL | 0 refills | Status: AC | PRN

## 2022-04-04 MED ORDER — DICLOFENAC SODIUM 1 % EX GEL: 2.0000 g | Freq: Four times a day (QID) | CUTANEOUS | 0 refills | Status: DC | PRN
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
## 2022-07-04 ENCOUNTER — Emergency Department (HOSPITAL_BASED_OUTPATIENT_CLINIC_OR_DEPARTMENT_OTHER)
Admission: EM | Admit: 2022-07-04 | Discharge: 2022-07-04 | Disposition: A | Discharge status: Home / Self Care | Payer: 59 | Attending: Emergency Medicine | Admitting: Emergency Medicine

## 2022-07-04 ENCOUNTER — Encounter (HOSPITAL_BASED_OUTPATIENT_CLINIC_OR_DEPARTMENT_OTHER): Payer: Self-pay

## 2022-07-04 ENCOUNTER — Other Ambulatory Visit: Payer: Self-pay

## 2022-07-04 ENCOUNTER — Emergency Department (HOSPITAL_BASED_OUTPATIENT_CLINIC_OR_DEPARTMENT_OTHER): Payer: 59

## 2022-07-04 DIAGNOSIS — I129 Hypertensive chronic kidney disease with stage 1 through stage 4 chronic kidney disease, or unspecified chronic kidney disease: Secondary | ICD-10-CM | POA: Insufficient documentation

## 2022-07-04 DIAGNOSIS — E119 Type 2 diabetes mellitus without complications: Secondary | ICD-10-CM

## 2022-07-04 DIAGNOSIS — E1165 Type 2 diabetes mellitus with hyperglycemia: Secondary | ICD-10-CM | POA: Diagnosis not present

## 2022-07-04 DIAGNOSIS — R739 Hyperglycemia, unspecified: Secondary | ICD-10-CM

## 2022-07-04 DIAGNOSIS — N1832 Chronic kidney disease, stage 3b: Secondary | ICD-10-CM | POA: Insufficient documentation

## 2022-07-04 DIAGNOSIS — I10 Essential (primary) hypertension: Secondary | ICD-10-CM

## 2022-07-04 DIAGNOSIS — E1122 Type 2 diabetes mellitus with diabetic chronic kidney disease: Secondary | ICD-10-CM | POA: Diagnosis not present

## 2022-07-04 DIAGNOSIS — Z794 Long term (current) use of insulin: Secondary | ICD-10-CM | POA: Diagnosis not present

## 2022-07-04 DIAGNOSIS — R072 Precordial pain: Secondary | ICD-10-CM

## 2022-07-04 LAB — BASIC METABOLIC PANEL
Anion gap: 13 (ref 5–15)
BUN: 28 mg/dL — ABNORMAL HIGH (ref 8–23)
CO2: 26 mmol/L (ref 22–32)
Calcium: 9.2 mg/dL (ref 8.9–10.3)
Chloride: 92 mmol/L — ABNORMAL LOW (ref 98–111)
Creatinine, Ser: 2.98 mg/dL — ABNORMAL HIGH (ref 0.61–1.24)
GFR, Estimated: 23 mL/min — ABNORMAL LOW (ref >60)
Glucose, Bld: 426 mg/dL — ABNORMAL HIGH (ref 70–99)
Potassium: 3.4 mmol/L — ABNORMAL LOW (ref 3.5–5.1)
Sodium: 131 mmol/L — ABNORMAL LOW (ref 135–145)

## 2022-07-04 LAB — CBC
HCT: 42.9 % (ref 39.0–52.0)
Hemoglobin: 14.1 g/dL (ref 13.0–17.0)
MCH: 29.1 pg (ref 26.0–34.0)
MCHC: 32.9 g/dL (ref 30.0–36.0)
MCV: 88.6 fL (ref 80.0–100.0)
Platelets: 331 10*3/uL (ref 150–400)
RBC: 4.84 MIL/uL (ref 4.22–5.81)
RDW: 12.1 % (ref 11.5–15.5)
WBC: 16 10*3/uL — ABNORMAL HIGH (ref 4.0–10.5)
nRBC: 0 % (ref 0.0–0.2)

## 2022-07-04 LAB — TROPONIN I (HIGH SENSITIVITY)
Troponin I (High Sensitivity): 22 ng/L — ABNORMAL HIGH (ref <18)
Troponin I (High Sensitivity): 25 ng/L — ABNORMAL HIGH (ref <18)

## 2022-07-04 LAB — CBG MONITORING, ED: Glucose-Capillary: 338 mg/dL — ABNORMAL HIGH (ref 70–99)

## 2022-07-04 MED ORDER — FAMOTIDINE 20 MG PO TABS
20.0000 mg | ORAL_TABLET | Freq: Once | ORAL | Status: AC
  Administered 2022-07-04: 20 mg via ORAL
  Filled 2022-07-04: qty 1

## 2022-07-04 MED ORDER — ALUM & MAG HYDROXIDE-SIMETH 200-200-20 MG/5ML PO SUSP
30.0000 mL | Freq: Once | ORAL | Status: AC
  Administered 2022-07-04: 30 mL via ORAL
  Filled 2022-07-04: qty 30

## 2022-07-04 MED ORDER — ACETAMINOPHEN 500 MG PO TABS
1000.0000 mg | ORAL_TABLET | Freq: Once | ORAL | Status: AC
  Administered 2022-07-04: 1000 mg via ORAL
  Filled 2022-07-04: qty 2

## 2022-07-04 MED ORDER — POTASSIUM CHLORIDE CRYS ER 20 MEQ PO TBCR
40.0000 meq | EXTENDED_RELEASE_TABLET | Freq: Once | ORAL | Status: AC
  Administered 2022-07-04: 40 meq via ORAL
  Filled 2022-07-04: qty 2

## 2022-07-04 MED ORDER — INSULIN ASPART 100 UNIT/ML IJ SOLN
12.0000 [IU] | Freq: Once | INTRAMUSCULAR | Status: AC
  Administered 2022-07-04: 12 [IU] via SUBCUTANEOUS

## 2022-07-04 NOTE — ED Notes (Signed)
Called to pts room. Reports right sided CP has started again while talking on phone with family. Pt described as throbbing. Lasted approx 4 mins now pain is decreasing. EDp notified

## 2022-07-04 NOTE — ED Triage Notes (Signed)
Pt states yesterday at church he experienced CP x7-24min, intermittent episodes since. Resolved w rest, no cardiac hx

## 2022-07-04 NOTE — ED Notes (Addendum)
Pt given water and ice chips with previous meds administration, drank all the water, tolerated well.

## 2022-07-04 NOTE — Discharge Instructions (Addendum)
It was our pleasure to provide your ER care today - we hope that you feel better.  For chest discomfort, follow up closely with cardiologist in the coming week. Take an enteric coated aspirin a day. (If GI symptoms, you may try pepcid/maalox as need for symptom relief).   For your chronic kidney disease (elevated creatinine) and high blood sugar/diabetes and high blood pressure- follow up closely with primary carde doctor  this week - call office tomorrow AM to arrange appointment.  Also follow up closely with kidney specialist - discuss with your doctor coordinating this follow up.  Make sure to drink plenty of water/fluids, take diabetes meds as prescribed, follow diabetes meal plan, monitor sugars 4x/day and record values, and follow up closely with your doctor this week.  Regarding your chronic kidney disease, it is very important to work closely with your doctor to control your diabetes and hypertension. Also make sure to avoid taking nsaid type meds such as ibuprofen/motrin or aleve/naprosyn, or similar types of meds.   Return to ER right away if worse, new symptoms, recurrent/persistent chest pain, increased trouble breathing, or other emergency concern.

## 2022-07-04 NOTE — ED Provider Triage Note (Signed)
Emergency Medicine Provider Triage Evaluation Note  WITTEN CERTAIN , a 62 y.o. male  was evaluated in triage.  Pt complains of chest pain since yesterday off and on. Twice was with exertion, one was at rest. He reports that he is always SOB given his asthma and bronchitis. Reports a little lightheadedness, but no diaphoresis or nausea. No radiation of chest pain. Denies any leg swelling.   Review of Systems  Positive:  Negative:   Physical Exam  BP (!) 162/79 (BP Location: Right Arm)   Pulse 86   Temp 98 F (36.7 C)   Resp 20   Ht 5\' 9"  (1.753 m)   Wt 108 kg   SpO2 95%   BMI 35.15 kg/m  Gen:   Awake, no distress   Resp:  Normal effort  MSK:   Moves extremities without difficulty  Other:    Medical Decision Making  Medically screening exam initiated at 4:28 PM.  Appropriate orders placed.  Danna L Stranahan was informed that the remainder of the evaluation will be completed by another provider, this initial triage assessment does not replace that evaluation, and the importance of remaining in the ED until their evaluation is complete.  Chest pain order set placed earlier. Patient is HIV positive with an elevated white count. He reports his numbers are undetected and that he is compliant with his medication. Will list him as a level 2.   Sherrell Puller, [place] 07/04/22 [phone]

## 2022-07-04 NOTE — ED Notes (Signed)
Pt offered fluids, tolerated well, denies n/v, no cough or discomfort
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
## 2022-07-06 DIAGNOSIS — R110 Nausea: Secondary | ICD-10-CM | POA: Insufficient documentation

## 2022-07-06 DIAGNOSIS — Z794 Long term (current) use of insulin: Secondary | ICD-10-CM

## 2022-07-06 DIAGNOSIS — H35411 Lattice degeneration of retina, right eye: Secondary | ICD-10-CM

## 2022-07-06 DIAGNOSIS — H35363 Drusen (degenerative) of macula, bilateral: Secondary | ICD-10-CM

## 2022-07-06 HISTORY — DX: Lattice degeneration of retina, right eye: H35.411

## 2022-07-06 HISTORY — DX: Nausea: R11.0

## 2022-07-06 HISTORY — DX: Drusen (degenerative) of macula, bilateral: H35.363

## 2022-07-14 ENCOUNTER — Other Ambulatory Visit: Payer: Self-pay

## 2022-07-14 DIAGNOSIS — G4733 Obstructive sleep apnea (adult) (pediatric): Secondary | ICD-10-CM | POA: Insufficient documentation

## 2022-07-14 DIAGNOSIS — J4520 Mild intermittent asthma, uncomplicated: Secondary | ICD-10-CM | POA: Insufficient documentation

## 2022-07-14 DIAGNOSIS — J4521 Mild intermittent asthma with (acute) exacerbation: Secondary | ICD-10-CM | POA: Insufficient documentation

## 2022-07-14 DIAGNOSIS — Z21 Asymptomatic human immunodeficiency virus [HIV] infection status: Secondary | ICD-10-CM | POA: Insufficient documentation

## 2022-07-14 DIAGNOSIS — E119 Type 2 diabetes mellitus without complications: Secondary | ICD-10-CM | POA: Insufficient documentation

## 2022-07-14 DIAGNOSIS — I152 Hypertension secondary to endocrine disorders: Secondary | ICD-10-CM | POA: Insufficient documentation

## 2022-07-14 DIAGNOSIS — I10 Essential (primary) hypertension: Secondary | ICD-10-CM | POA: Insufficient documentation

## 2022-07-14 DIAGNOSIS — E1169 Type 2 diabetes mellitus with other specified complication: Secondary | ICD-10-CM | POA: Insufficient documentation

## 2022-07-14 DIAGNOSIS — B20 Human immunodeficiency virus [HIV] disease: Secondary | ICD-10-CM | POA: Insufficient documentation

## 2022-07-14 HISTORY — DX: Mild intermittent asthma, uncomplicated: J45.20

## 2022-07-21 ENCOUNTER — Ambulatory Visit: Payer: 59 | Attending: Cardiology | Admitting: Cardiology

## 2022-08-30 ENCOUNTER — Other Ambulatory Visit: Payer: Self-pay

## 2022-09-08 ENCOUNTER — Ambulatory Visit: Payer: 59 | Attending: Cardiology | Admitting: Cardiology

## 2023-01-15 ENCOUNTER — Encounter (HOSPITAL_BASED_OUTPATIENT_CLINIC_OR_DEPARTMENT_OTHER): Payer: Self-pay | Admitting: Emergency Medicine

## 2023-01-15 ENCOUNTER — Emergency Department (HOSPITAL_BASED_OUTPATIENT_CLINIC_OR_DEPARTMENT_OTHER)
Admission: EM | Admit: 2023-01-15 | Discharge: 2023-01-15 | Discharge status: Against Medical Advice | Payer: 59 | Source: Home / Self Care

## 2023-01-15 DIAGNOSIS — M79602 Pain in left arm: Secondary | ICD-10-CM | POA: Insufficient documentation

## 2023-01-15 DIAGNOSIS — Z5321 Procedure and treatment not carried out due to patient leaving prior to being seen by health care provider: Secondary | ICD-10-CM | POA: Insufficient documentation

## 2023-01-15 NOTE — ED Triage Notes (Addendum)
Pt with intermittent LUE pain that starts near shoulder and travels to hand and cramps his hand up x 1 month; worst episode happened today

## 2023-11-26 ENCOUNTER — Emergency Department (HOSPITAL_BASED_OUTPATIENT_CLINIC_OR_DEPARTMENT_OTHER)

## 2023-11-26 ENCOUNTER — Encounter (HOSPITAL_BASED_OUTPATIENT_CLINIC_OR_DEPARTMENT_OTHER): Payer: Self-pay | Admitting: Emergency Medicine

## 2023-11-26 ENCOUNTER — Inpatient Hospital Stay (HOSPITAL_BASED_OUTPATIENT_CLINIC_OR_DEPARTMENT_OTHER)
Admission: EM | Admit: 2023-11-26 | Discharge: 2023-11-29 | DRG: 189 | Disposition: A | Discharge status: Home / Self Care | Attending: Internal Medicine | Admitting: Internal Medicine

## 2023-11-26 ENCOUNTER — Other Ambulatory Visit: Payer: Self-pay

## 2023-11-26 DIAGNOSIS — Z21 Asymptomatic human immunodeficiency virus [HIV] infection status: Secondary | ICD-10-CM | POA: Diagnosis present

## 2023-11-26 DIAGNOSIS — I160 Hypertensive urgency: Secondary | ICD-10-CM | POA: Diagnosis present

## 2023-11-26 DIAGNOSIS — Z1152 Encounter for screening for COVID-19: Secondary | ICD-10-CM

## 2023-11-26 DIAGNOSIS — J441 Chronic obstructive pulmonary disease with (acute) exacerbation: Principal | ICD-10-CM | POA: Diagnosis present

## 2023-11-26 DIAGNOSIS — G894 Chronic pain syndrome: Secondary | ICD-10-CM | POA: Diagnosis present

## 2023-11-26 DIAGNOSIS — J9601 Acute respiratory failure with hypoxia: Secondary | ICD-10-CM | POA: Diagnosis not present

## 2023-11-26 DIAGNOSIS — Z91148 Patient's other noncompliance with medication regimen for other reason: Secondary | ICD-10-CM

## 2023-11-26 DIAGNOSIS — E1122 Type 2 diabetes mellitus with diabetic chronic kidney disease: Secondary | ICD-10-CM | POA: Diagnosis present

## 2023-11-26 DIAGNOSIS — G4733 Obstructive sleep apnea (adult) (pediatric): Secondary | ICD-10-CM | POA: Diagnosis present

## 2023-11-26 DIAGNOSIS — N184 Chronic kidney disease, stage 4 (severe): Secondary | ICD-10-CM | POA: Diagnosis present

## 2023-11-26 DIAGNOSIS — Z6836 Body mass index (BMI) 36.0-36.9, adult: Secondary | ICD-10-CM

## 2023-11-26 DIAGNOSIS — Z79899 Other long term (current) drug therapy: Secondary | ICD-10-CM

## 2023-11-26 DIAGNOSIS — E1165 Type 2 diabetes mellitus with hyperglycemia: Secondary | ICD-10-CM | POA: Diagnosis present

## 2023-11-26 DIAGNOSIS — E66812 Obesity, class 2: Secondary | ICD-10-CM | POA: Diagnosis present

## 2023-11-26 DIAGNOSIS — Z8249 Family history of ischemic heart disease and other diseases of the circulatory system: Secondary | ICD-10-CM

## 2023-11-26 DIAGNOSIS — E1159 Type 2 diabetes mellitus with other circulatory complications: Secondary | ICD-10-CM | POA: Diagnosis present

## 2023-11-26 DIAGNOSIS — Z794 Long term (current) use of insulin: Secondary | ICD-10-CM | POA: Diagnosis not present

## 2023-11-26 DIAGNOSIS — Z7951 Long term (current) use of inhaled steroids: Secondary | ICD-10-CM

## 2023-11-26 DIAGNOSIS — I152 Hypertension secondary to endocrine disorders: Secondary | ICD-10-CM | POA: Diagnosis present

## 2023-11-26 DIAGNOSIS — E113211 Type 2 diabetes mellitus with mild nonproliferative diabetic retinopathy with macular edema, right eye: Secondary | ICD-10-CM

## 2023-11-26 DIAGNOSIS — E785 Hyperlipidemia, unspecified: Secondary | ICD-10-CM | POA: Diagnosis present

## 2023-11-26 DIAGNOSIS — E1169 Type 2 diabetes mellitus with other specified complication: Secondary | ICD-10-CM | POA: Diagnosis present

## 2023-11-26 DIAGNOSIS — J069 Acute upper respiratory infection, unspecified: Secondary | ICD-10-CM | POA: Diagnosis present

## 2023-11-26 DIAGNOSIS — R7989 Other specified abnormal findings of blood chemistry: Secondary | ICD-10-CM

## 2023-11-26 DIAGNOSIS — J159 Unspecified bacterial pneumonia: Secondary | ICD-10-CM | POA: Diagnosis present

## 2023-11-26 DIAGNOSIS — J40 Bronchitis, not specified as acute or chronic: Secondary | ICD-10-CM | POA: Diagnosis not present

## 2023-11-26 DIAGNOSIS — Z7984 Long term (current) use of oral hypoglycemic drugs: Secondary | ICD-10-CM

## 2023-11-26 DIAGNOSIS — R0902 Hypoxemia: Principal | ICD-10-CM

## 2023-11-26 DIAGNOSIS — Z833 Family history of diabetes mellitus: Secondary | ICD-10-CM

## 2023-11-26 DIAGNOSIS — E113291 Type 2 diabetes mellitus with mild nonproliferative diabetic retinopathy without macular edema, right eye: Secondary | ICD-10-CM | POA: Diagnosis present

## 2023-11-26 DIAGNOSIS — I2510 Atherosclerotic heart disease of native coronary artery without angina pectoris: Secondary | ICD-10-CM | POA: Diagnosis present

## 2023-11-26 DIAGNOSIS — Z888 Allergy status to other drugs, medicaments and biological substances status: Secondary | ICD-10-CM

## 2023-11-26 DIAGNOSIS — R0602 Shortness of breath: Secondary | ICD-10-CM | POA: Diagnosis present

## 2023-11-26 DIAGNOSIS — T380X5A Adverse effect of glucocorticoids and synthetic analogues, initial encounter: Secondary | ICD-10-CM | POA: Diagnosis present

## 2023-11-26 DIAGNOSIS — J4521 Mild intermittent asthma with (acute) exacerbation: Secondary | ICD-10-CM | POA: Diagnosis present

## 2023-11-26 LAB — CBC WITH DIFFERENTIAL/PLATELET
Abs Immature Granulocytes: 0.11 10*3/uL — ABNORMAL HIGH (ref 0.00–0.07)
Basophils Absolute: 0.1 10*3/uL (ref 0.0–0.1)
Basophils Relative: 1 %
Eosinophils Absolute: 0.4 10*3/uL (ref 0.0–0.5)
Eosinophils Relative: 2 %
HCT: 39.3 % (ref 39.0–52.0)
Hemoglobin: 12.6 g/dL — ABNORMAL LOW (ref 13.0–17.0)
Immature Granulocytes: 1 %
Lymphocytes Relative: 11 %
Lymphs Abs: 1.7 10*3/uL (ref 0.7–4.0)
MCH: 28.3 pg (ref 26.0–34.0)
MCHC: 32.1 g/dL (ref 30.0–36.0)
MCV: 88.3 fL (ref 80.0–100.0)
Monocytes Absolute: 1.1 10*3/uL — ABNORMAL HIGH (ref 0.1–1.0)
Monocytes Relative: 7 %
Neutro Abs: 12.8 10*3/uL — ABNORMAL HIGH (ref 1.7–7.7)
Neutrophils Relative %: 78 %
Platelets: 288 10*3/uL (ref 150–400)
RBC: 4.45 MIL/uL (ref 4.22–5.81)
RDW: 13 % (ref 11.5–15.5)
WBC: 16.2 10*3/uL — ABNORMAL HIGH (ref 4.0–10.5)
nRBC: 0 % (ref 0.0–0.2)

## 2023-11-26 LAB — COMPREHENSIVE METABOLIC PANEL WITH GFR
ALT: 20 U/L (ref 0–44)
AST: 23 U/L (ref 15–41)
Albumin: 3.9 g/dL (ref 3.5–5.0)
Alkaline Phosphatase: 109 U/L (ref 38–126)
Anion gap: 13 (ref 5–15)
BUN: 16 mg/dL (ref 8–23)
CO2: 23 mmol/L (ref 22–32)
Calcium: 9.4 mg/dL (ref 8.9–10.3)
Chloride: 103 mmol/L (ref 98–111)
Creatinine, Ser: 2.08 mg/dL — ABNORMAL HIGH (ref 0.61–1.24)
GFR, Estimated: 35 mL/min — ABNORMAL LOW (ref >60)
Glucose, Bld: 117 mg/dL — ABNORMAL HIGH (ref 70–99)
Potassium: 3.8 mmol/L (ref 3.5–5.1)
Sodium: 140 mmol/L (ref 135–145)
Total Bilirubin: 1.2 mg/dL (ref 0.0–1.2)
Total Protein: 8.1 g/dL (ref 6.5–8.1)

## 2023-11-26 LAB — LACTIC ACID, PLASMA: Lactic Acid, Venous: 1.1 mmol/L (ref 0.5–1.9)

## 2023-11-26 LAB — TROPONIN T, HIGH SENSITIVITY
Troponin T High Sensitivity: 58 ng/L — ABNORMAL HIGH (ref <19)
Troponin T High Sensitivity: 55 ng/L — ABNORMAL HIGH (ref <19)

## 2023-11-26 LAB — GLUCOSE, CAPILLARY: Glucose-Capillary: 231 mg/dL — ABNORMAL HIGH (ref 70–99)

## 2023-11-26 LAB — RESP PANEL BY RT-PCR (RSV, FLU A&B, COVID)  RVPGX2
Influenza A by PCR: NEGATIVE
Influenza B by PCR: NEGATIVE
Resp Syncytial Virus by PCR: NEGATIVE
SARS Coronavirus 2 by RT PCR: NEGATIVE

## 2023-11-26 LAB — PRO BRAIN NATRIURETIC PEPTIDE: Pro Brain Natriuretic Peptide: 970 pg/mL — ABNORMAL HIGH (ref <300.0)

## 2023-11-26 MED ORDER — METHYLPREDNISOLONE SODIUM SUCC 125 MG IJ SOLR
125.0000 mg | Freq: Once | INTRAMUSCULAR | Status: AC
  Administered 2023-11-26: 125 mg via INTRAVENOUS
  Filled 2023-11-26: qty 2

## 2023-11-26 MED ORDER — IPRATROPIUM-ALBUTEROL 0.5-2.5 (3) MG/3ML IN SOLN
3.0000 mL | Freq: Once | RESPIRATORY_TRACT | Status: AC
  Administered 2023-11-26: 3 mL via RESPIRATORY_TRACT
  Filled 2023-11-26: qty 3

## 2023-11-26 MED ORDER — SODIUM CHLORIDE 0.9 % IV SOLN
500.0000 mg | Freq: Once | INTRAVENOUS | Status: AC
  Administered 2023-11-26: 500 mg via INTRAVENOUS
  Filled 2023-11-26: qty 5

## 2023-11-26 MED ORDER — ARFORMOTEROL TARTRATE 15 MCG/2ML IN NEBU
15.0000 ug | INHALATION_SOLUTION | Freq: Two times a day (BID) | RESPIRATORY_TRACT | Status: DC
  Administered 2023-11-27 – 2023-11-29 (×6): 15 ug via RESPIRATORY_TRACT
  Filled 2023-11-26 (×5): qty 2

## 2023-11-26 MED ORDER — ONDANSETRON HCL 4 MG/2ML IJ SOLN: 4.0000 mg | Freq: Four times a day (QID) | INTRAMUSCULAR | Status: DC | PRN

## 2023-11-26 MED ORDER — METHYLPREDNISOLONE SODIUM SUCC 40 MG IJ SOLR
40.0000 mg | Freq: Two times a day (BID) | INTRAMUSCULAR | Status: DC
  Administered 2023-11-27 – 2023-11-28 (×3): 40 mg via INTRAVENOUS
  Filled 2023-11-26 (×3): qty 1

## 2023-11-26 MED ORDER — SODIUM CHLORIDE 0.9 % IV SOLN
1.0000 g | Freq: Once | INTRAVENOUS | Status: AC
  Administered 2023-11-26: 1 g via INTRAVENOUS
  Filled 2023-11-26: qty 10

## 2023-11-26 MED ORDER — LINACLOTIDE 145 MCG PO CAPS
290.0000 ug | ORAL_CAPSULE | Freq: Every day | ORAL | Status: DC
  Administered 2023-11-27 – 2023-11-28 (×2): 290 ug via ORAL
  Filled 2023-11-26 (×3): qty 2

## 2023-11-26 MED ORDER — ABACAVIR-DOLUTEGRAVIR-LAMIVUD 600-50-300 MG PO TABS
1.0000 | ORAL_TABLET | Freq: Every day | ORAL | Status: DC
  Administered 2023-11-27 – 2023-11-29 (×3): 1 via ORAL
  Filled 2023-11-26 (×3): qty 1

## 2023-11-26 MED ORDER — GUAIFENESIN ER 600 MG PO TB12
600.0000 mg | ORAL_TABLET | Freq: Two times a day (BID) | ORAL | Status: DC
  Administered 2023-11-27 – 2023-11-29 (×6): 600 mg via ORAL
  Filled 2023-11-26 (×6): qty 1

## 2023-11-26 MED ORDER — IPRATROPIUM-ALBUTEROL 0.5-2.5 (3) MG/3ML IN SOLN
3.0000 mL | Freq: Four times a day (QID) | RESPIRATORY_TRACT | Status: DC
  Administered 2023-11-26: 3 mL via RESPIRATORY_TRACT
  Filled 2023-11-26: qty 3

## 2023-11-26 MED ORDER — HYDRALAZINE HCL 20 MG/ML IJ SOLN
10.0000 mg | INTRAMUSCULAR | Status: DC | PRN
  Administered 2023-11-26: 10 mg via INTRAVENOUS
  Filled 2023-11-26: qty 1

## 2023-11-26 MED ORDER — AMLODIPINE BESYLATE 10 MG PO TABS
10.0000 mg | ORAL_TABLET | Freq: Every day | ORAL | Status: DC
  Administered 2023-11-27 – 2023-11-29 (×3): 10 mg via ORAL
  Filled 2023-11-26 (×3): qty 1

## 2023-11-26 MED ORDER — ONDANSETRON HCL 4 MG PO TABS: 4.0000 mg | ORAL_TABLET | Freq: Four times a day (QID) | ORAL | Status: DC | PRN

## 2023-11-26 MED ORDER — SODIUM CHLORIDE 0.9% FLUSH
3.0000 mL | Freq: Two times a day (BID) | INTRAVENOUS | Status: DC
  Administered 2023-11-27 – 2023-11-29 (×5): 3 mL via INTRAVENOUS

## 2023-11-26 MED ORDER — PRAVASTATIN SODIUM 40 MG PO TABS: 40.0000 mg | ORAL_TABLET | Freq: Every day | ORAL | Status: DC

## 2023-11-26 MED ORDER — SENNOSIDES-DOCUSATE SODIUM 8.6-50 MG PO TABS: 1.0000 | ORAL_TABLET | Freq: Every evening | ORAL | Status: DC | PRN

## 2023-11-26 MED ORDER — CARVEDILOL 6.25 MG PO TABS
6.2500 mg | ORAL_TABLET | Freq: Two times a day (BID) | ORAL | Status: DC
  Administered 2023-11-27 – 2023-11-28 (×4): 6.25 mg via ORAL
  Filled 2023-11-26 (×4): qty 1

## 2023-11-26 MED ORDER — IRBESARTAN 150 MG PO TABS
300.0000 mg | ORAL_TABLET | Freq: Every day | ORAL | Status: DC
  Administered 2023-11-26 – 2023-11-29 (×4): 300 mg via ORAL
  Filled 2023-11-26 (×4): qty 2

## 2023-11-26 MED ORDER — BUDESONIDE 0.25 MG/2ML IN SUSP
0.2500 mg | Freq: Two times a day (BID) | RESPIRATORY_TRACT | Status: DC
  Administered 2023-11-27 – 2023-11-29 (×6): 0.25 mg via RESPIRATORY_TRACT
  Filled 2023-11-26 (×5): qty 2

## 2023-11-26 MED ORDER — HEPARIN SODIUM (PORCINE) 5000 UNIT/ML IJ SOLN
5000.0000 [IU] | Freq: Three times a day (TID) | INTRAMUSCULAR | Status: DC
  Administered 2023-11-27 – 2023-11-29 (×7): 5000 [IU] via SUBCUTANEOUS
  Filled 2023-11-26 (×7): qty 1

## 2023-11-26 MED ORDER — ACETAMINOPHEN 325 MG PO TABS: 650.0000 mg | ORAL_TABLET | Freq: Four times a day (QID) | ORAL | Status: DC | PRN

## 2023-11-26 MED ORDER — INSULIN ASPART 100 UNIT/ML IJ SOLN
0.0000 [IU] | Freq: Three times a day (TID) | INTRAMUSCULAR | Status: DC
  Administered 2023-11-27: 2 [IU] via SUBCUTANEOUS
  Administered 2023-11-27: 8 [IU] via SUBCUTANEOUS
  Administered 2023-11-28: 2 [IU] via SUBCUTANEOUS
  Administered 2023-11-28: 3 [IU] via SUBCUTANEOUS
  Administered 2023-11-28: 5 [IU] via SUBCUTANEOUS

## 2023-11-26 MED ORDER — ACETAMINOPHEN 500 MG PO TABS
1000.0000 mg | ORAL_TABLET | Freq: Once | ORAL | Status: AC
  Administered 2023-11-26: 1000 mg via ORAL
  Filled 2023-11-26: qty 2

## 2023-11-26 MED ORDER — INSULIN GLARGINE-YFGN 100 UNIT/ML ~~LOC~~ SOLN
40.0000 [IU] | Freq: Two times a day (BID) | SUBCUTANEOUS | Status: DC
  Administered 2023-11-27 – 2023-11-29 (×6): 40 [IU] via SUBCUTANEOUS
  Filled 2023-11-26 (×7): qty 0.4

## 2023-11-26 MED ORDER — INSULIN ASPART 100 UNIT/ML IJ SOLN
0.0000 [IU] | Freq: Every day | INTRAMUSCULAR | Status: DC
  Administered 2023-11-27 (×2): 2 [IU] via SUBCUTANEOUS

## 2023-11-26 MED ORDER — IOHEXOL 350 MG/ML SOLN
80.0000 mL | Freq: Once | INTRAVENOUS | Status: AC | PRN
  Administered 2023-11-26: 80 mL via INTRAVENOUS

## 2023-11-26 MED ORDER — IPRATROPIUM-ALBUTEROL 0.5-2.5 (3) MG/3ML IN SOLN: 3.0000 mL | Freq: Four times a day (QID) | RESPIRATORY_TRACT | Status: DC | PRN

## 2023-11-26 MED ORDER — ACETAMINOPHEN 650 MG RE SUPP: 650.0000 mg | Freq: Four times a day (QID) | RECTAL | Status: DC | PRN

## 2023-11-26 MED ORDER — TRAMADOL HCL 50 MG PO TABS: 50.0000 mg | ORAL_TABLET | Freq: Two times a day (BID) | ORAL | Status: DC | PRN

## 2023-11-26 NOTE — ED Provider Notes (Signed)
 [place] [facility] Provider Note   CSN: [id number] Arrival date & time: 11/26/23  1340     Patient presents with: Shortness of Breath   Larry Dickson is a 63 y.o. male.  With past medical history of diabetes, HIV on antiviral, productive sleep apnea, hyperlipidemia, hypertension presents to emergency room with complaint of 5 days of productive cough, chest soreness and shortness of breath.  He has been on steroids for 5 days prescribed by primary care without any improvement.  Has been using albuterol  inhaler at home without significant improvement.  Has not noticed any swelling in feet and ankles.  No calf tenderness or unilateral swelling.  No prior history of DVT PE. Has not taken medication today.     Shortness of Breath      Prior to Admission medications   Medication Sig Start Date End Date Taking? Authorizing Provider  albuterol  (PROVENTIL  HFA;VENTOLIN  HFA) 108 (90 BASE) MCG/ACT inhaler Inhale 1-2 puffs into the lungs every 6 (six) hours as needed for wheezing or shortness of breath.    [provider]  albuterol  (PROVENTIL ) (5 MG/ML) 0.5% nebulizer solution Take 2.5 mg by nebulization every 6 (six) hours as needed for wheezing or shortness of breath.    [provider]  amLODipine (NORVASC) 10 MG tablet Take 10 mg by mouth daily.    [provider]  azithromycin  (ZITHROMAX ) 250 MG tablet Take 1 tablet (250 mg total) by mouth daily. Take first 2 tablets together, then 1 every day until finished. 11/29/21   Phebe Fonda RAMAN, MD  BREZTRI AEROSPHERE 160-9-4.8 MCG/ACT AERO Inhale 2 puffs into the lungs daily. 04/27/22   [provider]  budesonide-formoterol (SYMBICORT) 160-4.5 MCG/ACT inhaler Inhale 2 puffs into the lungs 2 (two) times daily.    [provider]  diclofenac  Sodium (VOLTAREN ) 1 % GEL Apply 2 g topically 4 (four) times daily as needed. 04/04/22   Long, Fonda MATSU, MD   efavirenz-emtricitabine-tenofovir (ATRIPLA) 600-200-300 MG per tablet Take 1 tablet by mouth at bedtime.    [provider]  FARXIGA 10 MG TABS tablet Take 10 mg by mouth every morning. 04/13/22   [provider]  fenofibrate (TRICOR) 145 MG tablet Take 145 mg by mouth daily. 04/27/22   [provider]  fluticasone (FLONASE) 50 MCG/ACT nasal spray Place 1 spray into both nostrils daily.    [provider]  HYDROcodone -homatropine (HYCODAN) 5-1.5 MG/5ML syrup Take 5 mLs by mouth every 6 (six) hours as needed for cough. 08/29/14   Lenor Hollering, MD  insulin  glargine (LANTUS) 100 UNIT/ML injection Inject 62 Units into the skin at bedtime.    [provider]  LINZESS 290 MCG CAPS capsule Take 290 mcg by mouth daily. 05/25/22   [provider]  methocarbamol  (ROBAXIN ) 500 MG tablet Take 1 tablet (500 mg total) by mouth every 8 (eight) hours as needed. 04/04/22   Long, Joshua G, MD  potassium chloride  SA (KLOR-CON  M) 20 MEQ tablet Take 20 mEq by mouth daily. 07/19/22   [provider]  pravastatin (PRAVACHOL) 40 MG tablet Take 40 mg by mouth daily. 05/11/22   [provider]  predniSONE  (DELTASONE ) 20 MG tablet 3 tabs po day one, then 2 po daily x 4 days 08/29/14   Lenor Hollering, MD  telmisartan (MICARDIS) 80 MG tablet Take 80 mg by mouth daily. 04/27/22   [provider]  torsemide (DEMADEX) 20 MG tablet Take 40 mg by mouth daily  as needed (fluid or edema). 05/10/22   [provider]  traMADol  (ULTRAM ) 50 MG tablet Take 1 tablet (50 mg total) by mouth every 6 (six) hours as needed. 09/30/14   Patsey Lot, MD  valACYclovir (VALTREX) 500 MG tablet Take 500 mg by mouth daily. 06/07/22   [provider]    Allergies: Dapsone and Primaquine    Review of Systems  Respiratory:  Positive for shortness of breath.     Updated Vital Signs BP (!) 203/110 (BP Location: Left Arm)   Pulse 88   Temp 99.7 F (37.6  C) (Oral)   Resp (!) 37   Ht 5' 8 (1.727 m)   Wt 108.9 kg   SpO2 92%   BMI 36.49 kg/m   Physical Exam  (all labs ordered are listed, but only abnormal results are displayed) Labs Reviewed  CBC WITH DIFFERENTIAL/PLATELET - Abnormal; Notable for the following components:      Result Value   WBC 16.2 (*)    Hemoglobin 12.6 (*)    Neutro Abs 12.8 (*)    Monocytes Absolute 1.1 (*)    Abs Immature Granulocytes 0.11 (*)    All other components within normal limits  COMPREHENSIVE METABOLIC PANEL WITH GFR - Abnormal; Notable for the following components:   Glucose, Bld 117 (*)    Creatinine, Ser 2.08 (*)    GFR, Estimated 35 (*)    All other components within normal limits  PRO BRAIN NATRIURETIC PEPTIDE - Abnormal; Notable for the following components:   Pro Brain Natriuretic Peptide 970.0 (*)    All other components within normal limits  TROPONIN T, HIGH SENSITIVITY - Abnormal; Notable for the following components:   Troponin T High Sensitivity 58 (*)    All other components within normal limits  RESP PANEL BY RT-PCR (RSV, FLU A&B, COVID)  RVPGX2  CULTURE, BLOOD (ROUTINE X 2)  CULTURE, BLOOD (ROUTINE X 2)  LACTIC ACID, PLASMA  LACTIC ACID, PLASMA    EKG: None  Radiology: CT Angio Chest PE W and/or Wo Contrast Result Date: 11/26/2023 EXAMINATION: CTA CHEST PE CLINICAL INDICATION: Pulmonary embolism (PE) suspected, high prob TECHNIQUE: This examination was performed according to an angiographic protocol with 3D post-processing. This involves 3D reconstructions, MIPs, volume rendered images and/or shaded surface rendering. One or more of the following dose reduction techniques were used: Automated exposure control, adjustment of the mA and/or kV according to patient size, and/or iterative reconstruction. Unless otherwise specified, incidental findings do not require dedicated imaging follow-up. COMPARISON: No prior exam. FINDINGS: The pulmonary arteries are well-opacified. No  intraluminal filling defects are identified. There is no thoracic aortic aneurysm. Mild global cardiac enlargement is present. No pericardial effusion is present. There are small bilateral pleural effusions. Enlarged right paratracheal lymph nodes are present measuring up to 1.7 x 1.6 cm. An anterior mediastinal lymph node is present in the midline measuring 1.7 x 1.4 cm. The thyroid gland has a normal appearance. The esophagus is within normal limits. Groundglass attenuation is present in both lungs with a mosaic attenuation pattern in the lung bases. There is a calcified granuloma in the lingula. Mild bibasilar atelectasis is present. No significant abnormality is identified in the upper abdomen. There is no acute fracture or destructive process. IMPRESSION: 1. No evidence of pulmonary embolism. 2. Groundglass attenuation in the lungs with a mosaic attenuation pattern in the lung bases. This is nonspecific and is most likely due to small airways disease. 3. Mild mediastinal adenopathy. This is nonspecific  and may be reactive. Electronically signed by: Eddy Oar MD 11/26/2023 03:39 PM EDT RP Workstation: [id number]   DG Chest Portable 1 View Result Date: 11/26/2023 CLINICAL DATA:  Shortness of breath and cough 5 days. No improvement on steroid therapy. EXAM: PORTABLE CHEST 1 VIEW COMPARISON:  07/04/2022 FINDINGS: Lungs are adequately inflated without focal airspace consolidation or effusion. There is minimal prominence of the central perihilar markings which may be due to mild vascular congestion versus acute bronchitic process. Borderline cardiomegaly. Remainder of the exam is unchanged. IMPRESSION: Minimal prominence of the central perihilar markings which may be due to mild vascular congestion versus acute bronchitic process. Electronically Signed   By: Toribio Agreste M.D.   On: 11/26/2023 14:12     Procedures   Medications Ordered in the ED - No data to display                                   Medical Decision Making Amount and/or Complexity of Data Reviewed Labs: ordered. Radiology: ordered.  Risk Prescription drug management. Decision regarding hospitalization.   This patient presents to the ED for concern of cough, this involves an extensive number of treatment options, and is a complaint that carries with it a high risk of complications and morbidity.  The differential diagnosis includes ACS, PE, pneumonia, pneumothorax, CHF, viral URI, COPD exacerbation, asthma exacerbation   Co morbidities that complicate the patient evaluation  Diabetes, asthma, hypertension, HIV   Additional history obtained:  Additional history obtained from infectious disease visit showing appropriate however T cells   Lab Tests:  I personally interpreted labs.  The pertinent results include: CBC does show leukocytosis, he has no anemia Troponin elevated at 50 will obtain delta BNP is elevated at 900 Blood cultures pending, lactic within normal limits   Imaging Studies ordered:  I ordered imaging studies including chest x-ray showing possible acute bronchitis versus mild congestion.  Obtaining CTA to further characterize I independently visualized and interpreted imaging which showed acute bronchitis, mild congestion.  No PE I agree with the radiologist interpretation   Cardiac Monitoring: / EKG:  The patient was maintained on a cardiac monitor.  I personally viewed and interpreted the cardiac monitored which showed an underlying rhythm of: Sinus   Consultations Obtained:  I requested consultation with the hospitalist,  and discussed lab and imaging findings as well as pertinent plan - they recommend: admit   Problem List / ED Course / Critical interventions / Medication management  Patient presents to emergency room with chest pain, shortness of breath and cough.  Shortness of breath is mostly exertional and has not significantly proved with albuterol  inhaler.  On arrival he  has increased respiratory rate, white blood cell count thus started SIRS and obtain blood cultures.  Given elevated BNP and mild congestion seen on chest x-ray and not hypotensive, normal lactic large fluid bolus was not ordered.  BNP is elevated at 900 he has no history of heart failure, troponin is elevated at 58, getting delta.  Respiratory panel is negative.  Symptoms do seem consistent with likely pneumonia but obtaining CTA to rule out pulmonary embolism. Satting 88% on room air, placed on 2 L nasal cannula with improvement.  Has mild improvement in symptoms after receiving DuoNeb and Solu-Medrol. I ordered medication including DuoNeb, Solu-Medrol Reevaluation of the patient after these medicines showed that the patient stayed the same I have reviewed the patients home  medicines and have made adjustments as needed       Final diagnoses:  Hypoxia  Bronchitis  Elevated brain natriuretic peptide (BNP) level    ED Discharge Orders     None          Shermon Warren SAILOR, PA-C 11/26/23 1719    Elnor Savant A, DO 11/28/23 1612

## 2023-11-26 NOTE — ED Triage Notes (Signed)
 Pt with SHOB and cough x 5d; he is currently on steroid w/o improvement; reports prod cough

## 2023-11-26 NOTE — H&P (Signed)
 History and Physical    Larry Dickson:[id number] DOB: 03-14-1961 DOA: 11/26/2023  PCP: Beverley Norleen NOVAK, MD  Patient coming from: Home  I have personally briefly reviewed patient's old medical records in [facility]  Chief Complaint: Shortness of breath, cough  HPI: Larry Dickson is a 63 y.o. male with medical history significant for HIV, CKD stage IV, T2DM, HTN, HLD, mild intermittent asthma asthma, chronic pain syndrome, OSA on CPAP who presented to the ED for evaluation of shortness of breath and cough.  Patient reports about 4 days ago developing cough productive of mostly clear/white sputum with occasional yellow sputum.  He has had progressive exertional dyspnea that significantly worsened today.  He has had associated chills and diaphoresis.  He says he has had issues with bronchitis in the past and he initially called his PCP who sent in a prescription for prednisone .  Patient states that he is taking 5-6 days so far with only minimal relief.  He says he has not been using his home inhalers regularly.  He has not had any nausea, vomiting, abdominal pain, diarrhea, dysuria.  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  He denies tobacco, alcohol, drug use.  [facility] ED Course  Labs/Imaging on admission: I have personally reviewed following labs and imaging studies.  Initial vitals showed BP 203/110, pulse 86, RR 37, temp 99.7 F, SpO2 92% on room air.  Per EDP documentation SpO2 dropped to 88% on room air.  He was placed on 2L O2 via [place] with improvement >94%.  Labs showed WBC 16.2, hemoglobin 12.6, platelets 288, sodium 140, potassium 3.8, bicarb 23, BUN 16, creatinine 2.08, serum glucose 117, LFTs within normal limits, proBNP 970, troponin T 58 > 55.  Blood cultures in process.  SARS-CoV-2, influenza, RSV PCR negative.  CTA chest negative for  PE.  Groundglass attenuation of the lungs with a mosaic attenuation pattern seen in the lung bases.  This is nonspecific and most likely due to small airways disease.  Mild mediastinal adenopathy seen, nonspecific and may be reactive.  Patient was given IV Solu-Medrol 125 mg, IV ceftriaxone  and azithromycin , DuoNeb x 3.  The hospitalist service was consulted to admit.  Review of Systems: All systems reviewed and are negative except as documented in history of present illness above.   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 FSBS 153; BP 130/90 in office   OSA (obstructive sleep apnea)    Proteinuria 03/25/2013   Formatting of this note might be different from the original. 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 No pertinent surgical history.  Social History: Patient denies tobacco, alcohol, licit drug use.  Allergies  Allergen Reactions   Dapsone     Other Reaction(s): Other (See Comments)  G6PD deficiency   Primaquine     Other Reaction(s): Other (See Comments)  G6PD deficiency    Family History  Problem Relation Age of Onset   Diabetes Mother    Hypertension Father    Diabetes Father    Diabetes Brother    Diabetes Maternal Grandmother    Hypertension  Maternal Grandmother    Hypertension Maternal Grandfather      Prior to Admission medications   Medication Sig Start Date End Date Taking? Authorizing Provider  albuterol  (PROVENTIL  HFA;VENTOLIN  HFA) 108 (90 BASE) MCG/ACT inhaler Inhale 1-2 puffs into the lungs every 6 (six) hours as needed for wheezing or shortness of breath.    [provider]  albuterol  (PROVENTIL ) (5 MG/ML) 0.5% nebulizer solution Take 2.5 mg by nebulization every 6 (six) hours as needed for wheezing or shortness of breath.    [provider]  amLODipine (NORVASC) 10 MG tablet Take 10 mg by mouth daily.    [provider]  azithromycin  (ZITHROMAX ) 250 MG tablet Take 1 tablet (250 mg total) by mouth daily. Take first 2 tablets together, then 1 every day until finished. 11/29/21   Phebe Fonda RAMAN, MD  BREZTRI AEROSPHERE 160-9-4.8 MCG/ACT AERO Inhale 2 puffs into the lungs daily. 04/27/22   [provider]  budesonide-formoterol (SYMBICORT) 160-4.5 MCG/ACT inhaler Inhale 2 puffs into the lungs 2 (two) times daily.    [provider]  diclofenac  Sodium (VOLTAREN ) 1 % GEL Apply 2 g topically 4 (four) times daily as needed. 04/04/22   Long, Fonda MATSU, MD  efavirenz-emtricitabine-tenofovir (ATRIPLA) 600-200-300 MG per tablet Take 1 tablet by mouth at bedtime.    [provider]  FARXIGA 10 MG TABS tablet Take 10 mg by mouth every morning. 04/13/22   [provider]  fenofibrate (TRICOR) 145 MG tablet Take 145 mg by mouth daily. 04/27/22   [provider]  fluticasone (FLONASE) 50 MCG/ACT nasal spray Place 1 spray into both nostrils daily.    [provider]  HYDROcodone -homatropine (HYCODAN) 5-1.5 MG/5ML syrup Take 5 mLs by mouth every 6 (six) hours as needed for cough. 08/29/14   Lenor Hollering, MD  insulin  glargine (LANTUS) 100 UNIT/ML injection Inject 62 Units into the skin at bedtime.    [provider]  LINZESS 290 MCG CAPS capsule Take 290 mcg  by mouth daily. 05/25/22   [provider]  methocarbamol  (ROBAXIN ) 500 MG tablet Take 1 tablet (500 mg total) by mouth every 8 (eight) hours as needed. 04/04/22   Long, Joshua G, MD  potassium chloride  SA (KLOR-CON  M) 20 MEQ tablet Take 20 mEq by mouth daily. 07/19/22   [provider]  pravastatin (PRAVACHOL) 40 MG tablet Take 40 mg by mouth daily. 05/11/22   [provider]  predniSONE  (DELTASONE ) 20 MG tablet 3 tabs po day one, then 2 po daily x 4 days 08/29/14   Lenor Hollering, MD  telmisartan (MICARDIS) 80 MG tablet Take 80 mg by mouth daily. 04/27/22   [provider]  torsemide (DEMADEX) 20 MG tablet Take 40 mg by mouth daily as needed (fluid or edema). 05/10/22   [provider]  traMADol  (ULTRAM ) 50 MG tablet Take 1 tablet (50 mg total) by mouth every 6 (six) hours as needed. 09/30/14   Patsey Lot, MD  valACYclovir (VALTREX) 500 MG tablet Take 500 mg by mouth daily. 06/07/22   [provider]  Physical Exam: Vitals:   11/26/23 1930 11/26/23 2000 11/26/23 2104 11/27/23 0002  BP: (!) 181/100 (!) 175/87 (!) 196/110   Pulse: 90 87 92   Resp: (!) 22 (!) 37 18   Temp:   98.1 F (36.7 C)   TempSrc:      SpO2: 100% 94% 96% 98%  Weight:      Height:       Constitutional: Resting in bed, NAD, calm, comfortable Eyes: EOMI, lids and conjunctivae normal ENMT: Mucous membranes are moist. Posterior pharynx clear of any exudate or lesions.Normal dentition.  Neck: normal, supple, no masses. Respiratory: End expiratory wheezing throughout the lung fields. Normal respiratory effort while on 2 L O2 via [place]. No accessory muscle use.  Cardiovascular: Regular rate and rhythm, no murmurs / rubs / gallops. No extremity edema. 2+ pedal pulses. Abdomen: no tenderness, no masses palpated. Musculoskeletal: no clubbing / cyanosis. No joint deformity upper and lower extremities. Good ROM, no contractures. Normal muscle tone.  Skin: no rashes, lesions,  ulcers. No induration Neurologic: Sensation intact. Strength 5/5 in all 4.  Psychiatric: Normal judgment and insight. Alert and oriented x 3. Normal mood.   EKG: Personally reviewed. Sinus rhythm, rate 85, PACs.  Not significantly changed when compared to previous.  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 male with medical history significant for HIV, CKD stage IV, T2DM, HTN, HLD, mild intermittent asthma asthma, chronic pain syndrome, OSA on CPAP who is admitted with acute hypoxic respiratory failure due to asthma exacerbation.  Assessment and Plan: Acute hypoxic respiratory failure due to exacerbation of mild intermittent asthma: Exam and clinical history consistent with acute hypoxic respiratory failure due to a exacerbation of asthma, suspect triggered by viral URI.  proBNP mildly elevated although patient does not have any evidence of volume overload.  CTA chest negative for PE, effusion, edema, or consolidation.  SpO2 was down to 88% on room air per ED documentation, currently stable on 2 L of [place].  Leukocytosis likely related to recent steroid use. - Start Brovana/Pulmicort bid - DuoNebs as needed - IV Solu-Medrol 40 mg twice daily - Continue supplemental oxygen as needed, wean as able - COVID, influenza, RSV negative - Check full respiratory viral panel - Incentive spirometer, flutter valve, Mucinex  CKD stage IV: Creatinine 2.08 on admission, improved from previous 2.72 on 10/13/2023.   Continue to monitor.  Hypertension: Significantly elevated blood pressure on arrival.  Patient admits he has not been taking his medications consistently as prescribed. - Continue telmisartan (switched to irbesartan 300 mg daily per pharmacy formulary) - Continue amlodipine 10 mg daily - Continue carvedilol 6.25 mg twice daily - IV hydralazine 10 mg as needed  Type 2 diabetes: Placed on Semglee 40 units twice daily, moderate SSI w/ HS coverage.  Adjust as needed.  Holding other home meds.  Hyperlipidemia: Continue pravastatin.  OSA: Continue CPAP nightly.   DVT prophylaxis: heparin injection 5,000 Units Start: 11/27/23 0600 Code Status: Full code, confirmed with patient on admission Family Communication: Discussed with patient,  he has discussed with family Disposition Plan: From home and likely discharge to home pending clinical progress Consults called: None Severity of Illness: The appropriate patient status for this patient is INPATIENT. Inpatient status is judged to be reasonable and necessary in order to provide the required intensity of service to ensure the patient's safety. The patient's presenting symptoms, physical exam findings, and initial radiographic and laboratory data in the context of their chronic comorbidities is felt to place them at high risk for further clinical deterioration. Furthermore, it is not anticipated that the patient will be medically stable for discharge from the hospital within 2 midnights of admission.   * I certify that at the point of admission it is my clinical judgment that the patient will require inpatient hospital care spanning beyond 2 midnights from the point of admission due to high intensity of service, high risk for further deterioration and high frequency of surveillance required.DEWAINE Jorie Blanch MD [facility]  If 7PM-7AM, please contact night-coverage [email or web address]  11/27/2023, 12:07 AM

## 2023-11-27 DIAGNOSIS — J40 Bronchitis, not specified as acute or chronic: Secondary | ICD-10-CM | POA: Diagnosis not present

## 2023-11-27 DIAGNOSIS — R7989 Other specified abnormal findings of blood chemistry: Secondary | ICD-10-CM

## 2023-11-27 DIAGNOSIS — J9601 Acute respiratory failure with hypoxia: Secondary | ICD-10-CM | POA: Diagnosis not present

## 2023-11-27 LAB — CBC
HCT: 38.3 % — ABNORMAL LOW (ref 39.0–52.0)
Hemoglobin: 11.9 g/dL — ABNORMAL LOW (ref 13.0–17.0)
MCH: 28.8 pg (ref 26.0–34.0)
MCHC: 31.1 g/dL (ref 30.0–36.0)
MCV: 92.7 fL (ref 80.0–100.0)
Platelets: 264 10*3/uL (ref 150–400)
RBC: 4.13 MIL/uL — ABNORMAL LOW (ref 4.22–5.81)
RDW: 13.2 % (ref 11.5–15.5)
WBC: 17 10*3/uL — ABNORMAL HIGH (ref 4.0–10.5)
nRBC: 0 % (ref 0.0–0.2)

## 2023-11-27 LAB — RESPIRATORY PANEL BY PCR

## 2023-11-27 LAB — BASIC METABOLIC PANEL WITH GFR
Anion gap: 10 (ref 5–15)
BUN: 22 mg/dL (ref 8–23)
CO2: 24 mmol/L (ref 22–32)
Calcium: 8.9 mg/dL (ref 8.9–10.3)
Chloride: 103 mmol/L (ref 98–111)
Creatinine, Ser: 2.16 mg/dL — ABNORMAL HIGH (ref 0.61–1.24)
GFR, Estimated: 34 mL/min — ABNORMAL LOW (ref >60)
Glucose, Bld: 171 mg/dL — ABNORMAL HIGH (ref 70–99)
Potassium: 3.6 mmol/L (ref 3.5–5.1)
Sodium: 137 mmol/L (ref 135–145)

## 2023-11-27 LAB — HEMOGLOBIN A1C
Hgb A1c MFr Bld: 5.2 % (ref 4.8–5.6)
Mean Plasma Glucose: 102.54 mg/dL

## 2023-11-27 LAB — GLUCOSE, CAPILLARY
Glucose-Capillary: 218 mg/dL — ABNORMAL HIGH (ref 70–99)
Glucose-Capillary: 299 mg/dL — ABNORMAL HIGH (ref 70–99)
Glucose-Capillary: 242 mg/dL — ABNORMAL HIGH (ref 70–99)

## 2023-11-27 MED ORDER — AZITHROMYCIN 250 MG PO TABS
500.0000 mg | ORAL_TABLET | Freq: Every day | ORAL | Status: DC
  Administered 2023-11-27 – 2023-11-28 (×2): 500 mg via ORAL
  Filled 2023-11-27 (×2): qty 2

## 2023-11-27 MED ORDER — CLONIDINE HCL 0.1 MG PO TABS
0.2000 mg | ORAL_TABLET | Freq: Once | ORAL | Status: AC
  Administered 2023-11-27: 0.2 mg via ORAL
  Filled 2023-11-27: qty 2

## 2023-11-27 MED ORDER — INSULIN ASPART 100 UNIT/ML IJ SOLN
5.0000 [IU] | Freq: Three times a day (TID) | INTRAMUSCULAR | Status: DC
  Administered 2023-11-27 – 2023-11-28 (×3): 5 [IU] via SUBCUTANEOUS

## 2023-11-27 MED ORDER — PRAVASTATIN SODIUM 40 MG PO TABS
80.0000 mg | ORAL_TABLET | Freq: Every day | ORAL | Status: DC
  Administered 2023-11-27 – 2023-11-29 (×3): 80 mg via ORAL
  Filled 2023-11-27 (×3): qty 2

## 2023-11-27 MED ORDER — FUROSEMIDE 10 MG/ML IJ SOLN: 40.0000 mg | Freq: Two times a day (BID) | INTRAMUSCULAR | Status: DC

## 2023-11-27 MED ORDER — TRAZODONE HCL 50 MG PO TABS
50.0000 mg | ORAL_TABLET | Freq: Every day | ORAL | Status: DC
  Administered 2023-11-27 – 2023-11-28 (×2): 50 mg via ORAL
  Filled 2023-11-27 (×2): qty 1

## 2023-11-27 MED ORDER — FUROSEMIDE 10 MG/ML IJ SOLN
40.0000 mg | Freq: Two times a day (BID) | INTRAMUSCULAR | Status: DC
  Administered 2023-11-27 – 2023-11-28 (×4): 40 mg via INTRAVENOUS
  Filled 2023-11-27 (×4): qty 4

## 2023-11-27 MED ORDER — SODIUM CHLORIDE 0.9 % IV SOLN
1.0000 g | INTRAVENOUS | Status: DC
  Administered 2023-11-27 – 2023-11-28 (×2): 1 g via INTRAVENOUS
  Filled 2023-11-27 (×2): qty 10

## 2023-11-27 NOTE — Progress Notes (Signed)
   11/27/23 0015  BiPAP/CPAP/SIPAP  $ Face Mask Medium Yes  BiPAP/CPAP/SIPAP Pt Type Adult  BiPAP/CPAP/SIPAP Resmed  Mask Type Full face mask  Dentures removed? Not applicable  Mask Size Medium  Flow Rate 2 lpm  Patient Home Machine No  Patient Home Mask No  Patient Home Tubing No  Auto Titrate Yes  Minimum cmH2O 5 cmH2O  Maximum cmH2O 20 cmH2O  Device Plugged into RED Power Outlet Yes

## 2023-11-27 NOTE — Progress Notes (Signed)
   11/27/23 1248  TOC Brief Assessment  Insurance and Status Reviewed  Patient has primary care physician Yes  Home environment has been reviewed Single family home  Prior level of function: Independent  Prior/Current Home Services No current home services  Social Drivers of Health Review SDOH reviewed no interventions necessary  Readmission risk has been reviewed Yes  Transition of care needs no transition of care needs at this time

## 2023-11-27 NOTE — Progress Notes (Addendum)
 [facility]                                                                              Larry Dickson, is a 63 y.o. male, DOB - 08-Feb-1961, FMW:[id number] Admit date - 11/26/2023    Outpatient Primary MD for the patient is Beverley Norleen NOVAK, MD  LOS - 1  days  Chief Complaint  Patient presents with   Shortness of Breath       Brief summary   Patient is a 63 year old male with HIV, CKD stage IV, T2DM, HTN, hyperlipidemia, mild intermittent asthma, chronic pain syndrome, OSA on CPAP presented to ED with shortness of breath and cough.  Patient reported 4 days prior to admission, developed cough, productive with clear/white sputum with occasional yellow sputum.  He had progressive exertional dyspnea that significantly worsened on the day of admission with associated chills and diaphoresis.  Patient had initially called his PCP and was started on prednisone .  He has not been using his home inhalers regularly.  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  No tobacco, alcohol or drug use. In the ED, BP 203/110, pulse 86, RR 37, temp 99.7 F, O2 sats 92% on room air.  Per EDP O2 sats had dropped to 88% on room air. COVID, RSV, flu negative. CTA chest negative for PE, groundglass attenuation of the lungs with mosaic attenuation pattern seen in the lung bases, nonspecific and most likely due to small airway disease.  Mild mediastinal adenopathy, nonspecific and may be reactive. Patient was admitted for further workup.  Assessment & Plan       Acute hypoxic respiratory failure Acute asthma exacerbation, likely triggered by viral URI Bilateral CAP - Still having coughing, CTA chest negative for PE, showed groundglass attenuation in the lungs with mosaic pattern in the lung bases, nonspecific and most likely due to small airway disease.  -  COVID, flu, RSV negative.  Respiratory virus panel  negative.  Blood cultures negative so far - Wean O2 as tolerated, continue Pulmicort, Brovana twice daily, DuoNebs, IV Solu-Medrol - Placed on Zithromax , IV Rocephin , incentive spirometry  Acute on chronic heart failure, bilateral pleural effusions, history of CAD - proBNP 970, mildly elevated troponins.  CTA chest showed no PE, small airway disease, small bilateral pleural effusions - Patient he had a 2D echo and stress test outpatient in the past at [facility] and  everything was normal, unable to find 2D echo - Outpatient on torsemide 40 mg daily, placed on Lasix 40 mg IV twice daily, strict I's and O's and daily weights  CKD stage IV: -Creatinine 2.08 on admission, creatinine improved from previous 2.72 on 10/13/2023.   - Continue to monitor.  HIV -Continue Triumeq   Hypertension: -Significantly elevated  - Continue telmisartan, amlodipine, Coreg    Diabetes mellitus type 2, IDDM, uncontrolled with hyperglycemia Follow-up hemoglobin A1c  CBG (last 3)  Recent Labs    11/26/23 2328  GLUCAP 231*  - CBGs uncontrolled likely due to steroids, continue Semglee 40 units twice daily, moderate sliding scale  insulin , add meal coverage NovoLog  5 units 3 times daily AC   Hyperlipidemia: Continue pravastatin.   OSA: Continue CPAP nightly.   Obesity class II Estimated body mass index is 36.49 kg/m as calculated from the following:   Height as of this encounter: 5' 8 (1.727 m).   Weight as of this encounter: 108.9 kg.  Code Status: Full code DVT Prophylaxis:  heparin injection 5,000 Units Start: 11/27/23 0600   Level of Care: Level of care: Telemetry Medical Family Communication: Updated patient' Disposition Plan:      Remains inpatient appropriate:      Procedures:    Consultants:     Antimicrobials:   Anti-infectives (From admission, onward)    Start     Dose/Rate Route Frequency Ordered Stop   11/27/23 1000  abacavir-dolutegravir-lamiVUDine (TRIUMEQ)  600-50-300 MG per tablet 1 tablet        1 tablet Oral Daily 11/26/23 2357     11/26/23 1600  cefTRIAXone  (ROCEPHIN ) 1 g in sodium chloride 0.9 % 100 mL IVPB        1 g 200 mL/hr over 30 Minutes Intravenous  Once 11/26/23 1555 11/26/23 1647   11/26/23 1600  azithromycin  (ZITHROMAX ) 500 mg in sodium chloride 0.9 % 250 mL IVPB        500 mg 250 mL/hr over 60 Minutes Intravenous  Once 11/26/23 1555 11/26/23 1707          Medications  abacavir-dolutegravir-lamiVUDine  1 tablet Oral Daily   amLODipine  10 mg Oral Daily   arformoterol  15 mcg Nebulization BID   budesonide (PULMICORT) nebulizer solution  0.25 mg Nebulization BID   carvedilol  6.25 mg Oral BID WC   guaiFENesin  600 mg Oral BID   heparin  5,000 Units Subcutaneous Q8H   insulin  aspart  0-15 Units Subcutaneous TID WC   insulin  aspart  0-5 Units Subcutaneous QHS   insulin  glargine-yfgn  40 Units Subcutaneous BID   irbesartan  300 mg Oral Daily   linaclotide  290 mcg Oral QAC breakfast   methylPREDNISolone (SOLU-MEDROL) injection  40 mg Intravenous Q12H   pravastatin  80 mg Oral Daily   sodium chloride flush  3 mL Intravenous Q12H      Subjective:   Larry Dickson was seen and examined today.  Sitting up in the chair, no acute complaints, still having coughing and wheezing.  Patient denies dizziness, chest pain, shortness of breath, abdominal pain, N/V/D/C, new weakness, numbess, tingling. No acute events overnight.    Objective:   Vitals:   11/27/23 0250 11/27/23 0605 11/27/23 0717 11/27/23 0837  BP: (!) 160/86 (!) 166/95 (!) 168/101   Pulse: 87 80 80   Resp: 18  19   Temp: 98 F (36.7 C)  97.8 F (36.6 C)   TempSrc:   Oral   SpO2: 97% 96% 94% 94%  Weight:      Height:        Intake/Output Summary (Last 24 hours) at 11/27/2023 1042 Last data filed at 11/26/2023 1926 Gross per 24 hour  Intake 350 ml  Output 500 ml  Net -150 ml     Wt Readings from Last 3 Encounters:  11/26/23 108.9 kg  01/15/23  105.7 kg  07/04/22 108 kg     Exam General: Alert and oriented x 3, NAD Cardiovascular: S1 S2 auscultated,  RRR Respiratory: Expiratory wheezing+ bilaterally Gastrointestinal: Soft, nontender, nondistended, + bowel sounds Ext: no pedal edema bilaterally Neuro: Strength 5/5 upper and lower extremities bilaterally  Psych: Normal affect     Data Reviewed:  I have personally reviewed following labs    CBC Lab Results  Component Value Date   WBC 17.0 (H) 11/27/2023   RBC 4.13 (L) 11/27/2023   HGB 11.9 (L) 11/27/2023   HCT 38.3 (L) 11/27/2023   MCV 92.7 11/27/2023   MCH 28.8 11/27/2023   PLT 264 11/27/2023   MCHC 31.1 11/27/2023   RDW 13.2 11/27/2023   LYMPHSABS 1.7 11/26/2023   MONOABS 1.1 (H) 11/26/2023   EOSABS 0.4 11/26/2023   BASOSABS 0.1 11/26/2023     Last metabolic panel Lab Results  Component Value Date   NA 137 11/27/2023   K 3.6 11/27/2023   CL 103 11/27/2023   CO2 24 11/27/2023   BUN 22 11/27/2023   CREATININE 2.16 (H) 11/27/2023   GLUCOSE 171 (H) 11/27/2023   GFRNONAA 34 (L) 11/27/2023   GFRAA >60 03/18/2018   CALCIUM 8.9 11/27/2023   PROT 8.1 11/26/2023   ALBUMIN 3.9 11/26/2023   BILITOT 1.2 11/26/2023   ALKPHOS 109 11/26/2023   AST 23 11/26/2023   ALT 20 11/26/2023   ANIONGAP 10 11/27/2023    CBG (last 3)  Recent Labs    11/26/23 2328  GLUCAP 231*      Coagulation Profile: No results for input(s): INR, PROTIME in the last 168 hours.   Radiology Studies: I have personally reviewed the imaging studies  CT Angio Chest PE W and/or Wo Contrast Result Date: 11/26/2023 EXAMINATION: CTA CHEST PE CLINICAL INDICATION: Pulmonary embolism (PE) suspected, high prob TECHNIQUE: This examination was performed according to an angiographic protocol with 3D post-processing. This involves 3D reconstructions, MIPs, volume rendered images and/or shaded surface rendering. One or more of the following dose reduction techniques were used: Automated  exposure control, adjustment of the mA and/or kV according to patient size, and/or iterative reconstruction. Unless otherwise specified, incidental findings do not require dedicated imaging follow-up. COMPARISON: No prior exam. FINDINGS: The pulmonary arteries are well-opacified. No intraluminal filling defects are identified. There is no thoracic aortic aneurysm. Mild global cardiac enlargement is present. No pericardial effusion is present. There are small bilateral pleural effusions. Enlarged right paratracheal lymph nodes are present measuring up to 1.7 x 1.6 cm. An anterior mediastinal lymph node is present in the midline measuring 1.7 x 1.4 cm. The thyroid gland has a normal appearance. The esophagus is within normal limits. Groundglass attenuation is present in both lungs with a mosaic attenuation pattern in the lung bases. There is a calcified granuloma in the lingula. Mild bibasilar atelectasis is present. No significant abnormality is identified in the upper abdomen. There is no acute fracture or destructive process. IMPRESSION: 1. No evidence of pulmonary embolism. 2. Groundglass attenuation in the lungs with a mosaic attenuation pattern in the lung bases. This is nonspecific and is most likely due to small airways disease. 3. Mild mediastinal adenopathy. This is nonspecific and may be reactive. Electronically signed by: Eddy Oar MD 11/26/2023 03:39 PM EDT RP Workstation: [id number]   DG Chest Portable 1 View Result Date: 11/26/2023 CLINICAL DATA:  Shortness of breath and cough 5 days. No improvement on steroid therapy. EXAM: PORTABLE CHEST 1 VIEW COMPARISON:  07/04/2022 FINDINGS: Lungs are adequately inflated without focal airspace consolidation or effusion. There is minimal prominence of the central perihilar markings which may be due to mild vascular congestion versus acute bronchitic process. Borderline cardiomegaly. Remainder of the exam is unchanged. IMPRESSION: Minimal prominence of the  central perihilar markings which may  be due to mild vascular congestion versus acute bronchitic process. Electronically Signed   By: Toribio Agreste M.D.   On: 11/26/2023 14:12       Raynee Mccasland M.D. [facility] 11/27/2023, 10:42 AM  Available via Epic secure chat 7am-7pm After 7 pm, please refer to night coverage provider listed on amion.

## 2023-11-27 NOTE — Plan of Care (Signed)
  Problem: Education: Goal: Knowledge of General Education information will improve Description: Including pain rating scale, medication(s)/side effects and non-pharmacologic comfort measures Outcome: Progressing   Problem: Clinical Measurements: Goal: Will remain free from infection Outcome: Progressing   Problem: Elimination: Goal: Will not experience complications related to bowel motility Outcome: Progressing Goal: Will not experience complications related to urinary retention Outcome: Progressing   Problem: Safety: Goal: Ability to remain free from injury will improve Outcome: Progressing   Problem: Education: Goal: Ability to describe self-care measures that may prevent or decrease complications (Diabetes Survival Skills Education) will improve Outcome: Progressing

## 2023-11-27 NOTE — H&P (Incomplete)
 History and Physical    Larry Dickson FMW:[id number] DOB: September 28, 1960 DOA: 11/26/2023  PCP: Beverley Norleen NOVAK, MD  Patient coming from: Home  I have personally briefly reviewed patient's old medical records in [facility]  Chief Complaint: Shortness of breath, cough  HPI: Larry Dickson is a 63 y.o. male with medical history significant for HIV, CKD stage IV, T2DM, HTN, HLD, mild intermittent asthma asthma, chronic pain syndrome, OSA on CPAP who presented to the ED for evaluation of shortness of breath and cough.  Patient reports about 4 days ago developing cough productive of mostly clear/white sputum with occasional yellow sputum.  He has had progressive exertional dyspnea that significantly worsened today.  He has had associated chills and diaphoresis.  He says he has had issues with bronchitis in the past and he initially called his PCP who sent in a prescription for prednisone .  Patient states that he is taking 5-6 days so far with only minimal relief.  He says he has not been using his home inhalers regularly.  He has not had any nausea, vomiting, abdominal pain, diarrhea, dysuria.  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  He denies tobacco, alcohol, drug use.  [facility] ED Course  Labs/Imaging on admission: I have personally reviewed following labs and imaging studies.  Initial vitals showed BP 203/110, pulse 86, RR 37, temp 99.7 F, SpO2 92% on room air.  Per EDP documentation SpO2 dropped to 88% on room air.  He was placed on 2L O2 via [place] with improvement >94%.  Labs showed WBC 16.2, hemoglobin 12.6, platelets 288, sodium 140, potassium 3.8, bicarb 23, BUN 16, creatinine 2.08, serum glucose 117, LFTs within normal limits, proBNP 970, troponin T 58 > 55.  Blood cultures in process.  SARS-CoV-2, influenza, RSV PCR negative.  CTA chest negative for  PE.  Groundglass attenuation of the lungs with a mosaic attenuation pattern seen in the lung bases.  This is nonspecific and most likely due to small airways disease.  Mild mediastinal adenopathy seen, nonspecific and may be reactive.  Patient was given IV Solu-Medrol 125 mg, IV ceftriaxone  and azithromycin , DuoNeb x 3.  The hospitalist service was consulted to admit.  Review of Systems: All systems reviewed and are negative except as documented in history of present illness above.   Past Medical History:  Diagnosis Date  . Chronic pain syndrome 03/25/2013  . CKD (chronic kidney disease), stage IV (HCC) 11/26/2023  . Diabetes mellitus without complication (HCC)   . Drusen of macula, 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  . HIV (human immunodeficiency virus infection) (HCC)   . Hyperlipidemia associated with type 2 diabetes mellitus (HCC) 07/14/2022  . Hypertension   . Insomnia 09/15/2011  . 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  . Mild intermittent asthma without complication 07/14/2022  . Nausea 07/06/2022   Formatting of this note might be different from the original. FSBS 153; BP 130/90 in office  . OSA (obstructive sleep apnea)   . Proteinuria 03/25/2013   Formatting of this note might be different from the original. 10/2017: Prior evaluation by nephrology  . 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 No pertinent surgical history.  Social History: Patient denies tobacco, alcohol, licit drug use.  Allergies  Allergen Reactions  . Dapsone     Other Reaction(s): Other (See Comments)  G6PD deficiency  . Primaquine     Other Reaction(s): Other (See Comments)  G6PD deficiency    Family History  Problem Relation Age of Onset  . Diabetes Mother   . Hypertension Father   . Diabetes Father   . Diabetes Brother   . Diabetes Maternal Grandmother    . Hypertension Maternal Grandmother   . Hypertension Maternal Grandfather      Prior to Admission medications   Medication Sig Start Date End Date Taking? Authorizing Provider  albuterol  (PROVENTIL  HFA;VENTOLIN  HFA) 108 (90 BASE) MCG/ACT inhaler Inhale 1-2 puffs into the lungs every 6 (six) hours as needed for wheezing or shortness of breath.    [provider]  albuterol  (PROVENTIL ) (5 MG/ML) 0.5% nebulizer solution Take 2.5 mg by nebulization every 6 (six) hours as needed for wheezing or shortness of breath.    [provider]  amLODipine (NORVASC) 10 MG tablet Take 10 mg by mouth daily.    [provider]  azithromycin  (ZITHROMAX ) 250 MG tablet Take 1 tablet (250 mg total) by mouth daily. Take first 2 tablets together, then 1 every day until finished. 11/29/21   Phebe Fonda RAMAN, MD  BREZTRI AEROSPHERE 160-9-4.8 MCG/ACT AERO Inhale 2 puffs into the lungs daily. 04/27/22   [provider]  budesonide-formoterol (SYMBICORT) 160-4.5 MCG/ACT inhaler Inhale 2 puffs into the lungs 2 (two) times daily.    [provider]  diclofenac  Sodium (VOLTAREN ) 1 % GEL Apply 2 g topically 4 (four) times daily as needed. 04/04/22   Long, Fonda MATSU, MD  efavirenz-emtricitabine-tenofovir (ATRIPLA) 600-200-300 MG per tablet Take 1 tablet by mouth at bedtime.    [provider]  FARXIGA 10 MG TABS tablet Take 10 mg by mouth every morning. 04/13/22   [provider]  fenofibrate (TRICOR) 145 MG tablet Take 145 mg by mouth daily. 04/27/22   [provider]  fluticasone (FLONASE) 50 MCG/ACT nasal spray Place 1 spray into both nostrils daily.    [provider]  HYDROcodone -homatropine (HYCODAN) 5-1.5 MG/5ML syrup Take 5 mLs by mouth every 6 (six) hours as needed for cough. 08/29/14   Lenor Hollering, MD  insulin  glargine (LANTUS) 100 UNIT/ML injection Inject 62 Units into the skin at bedtime.    [provider]  LINZESS 290 MCG CAPS  capsule Take 290 mcg by mouth daily. 05/25/22   [provider]  methocarbamol  (ROBAXIN ) 500 MG tablet Take 1 tablet (500 mg total) by mouth every 8 (eight) hours as needed. 04/04/22   Long, Joshua G, MD  potassium chloride  SA (KLOR-CON  M) 20 MEQ tablet Take 20 mEq by mouth daily. 07/19/22   [provider]  pravastatin (PRAVACHOL) 40 MG tablet Take 40 mg by mouth daily. 05/11/22   [provider]  predniSONE  (DELTASONE ) 20 MG tablet 3 tabs po day one, then 2 po daily x 4 days 08/29/14   Lenor Hollering, MD  telmisartan (MICARDIS) 80 MG tablet Take 80 mg by mouth daily. 04/27/22   [provider]  torsemide (DEMADEX) 20 MG tablet Take 40 mg by mouth daily as needed (fluid or edema). 05/10/22   [provider]  traMADol  (ULTRAM ) 50 MG tablet Take 1 tablet (50 mg total) by mouth every 6 (six) hours as needed. 09/30/14   Patsey Lot, MD  valACYclovir (VALTREX) 500 MG tablet Take 500 mg by mouth daily. 06/07/22   [provider]  Physical Exam: Vitals:   11/26/23 1929 11/26/23 1930 11/26/23 2000 11/26/23 2104  BP:  (!) 181/100 (!) 175/87 (!) 196/110  Pulse:  90 87 92  Resp:  (!) 22 (!) 37 18  Temp:    98.1 F (36.7 C)  TempSrc:      SpO2: 100% 100% 94% 96%  Weight:      Height:       Constitutional: Resting in bed, NAD, calm, comfortable Eyes: EOMI, lids and conjunctivae normal ENMT: Mucous membranes are moist. Posterior pharynx clear of any exudate or lesions.Normal dentition.  Neck: normal, supple, no masses. Respiratory: End expiratory wheezing throughout the lung fields. Normal respiratory effort while on 2 L O2 via [place]. No accessory muscle use.  Cardiovascular: Regular rate and rhythm, no murmurs / rubs / gallops. No extremity edema. 2+ pedal pulses. Abdomen: no tenderness, no masses palpated. Musculoskeletal: no clubbing / cyanosis. No joint deformity upper and lower extremities. Good ROM, no contractures. Normal muscle tone.   Skin: no rashes, lesions, ulcers. No induration Neurologic: Sensation intact. Strength 5/5 in all 4.  Psychiatric: Normal judgment and insight. Alert and oriented x 3. Normal mood.   EKG: Personally reviewed. Sinus rhythm, rate 85, PACs.  Not significantly changed when compared to previous.  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 male with medical history significant for HIV, CKD stage IV, T2DM, HTN, HLD, mild intermittent asthma asthma, chronic pain syndrome, OSA on CPAP who is admitted with acute hypoxic respiratory failure due to asthma exacerbation. *** Assessment and Plan: Acute hypoxic respiratory failure due to exacerbation of mild intermittent asthma: Exam and clinical history consistent with acute hypoxic respiratory failure due to a exacerbation of asthma, suspect triggered by viral URI.  proBNP mildly elevated although patient does not have any evidence of volume overload.  CTA chest negative for PE, effusion, edema, or consolidation.  SpO2 was down to 88% on room air per ED documentation, currently stable on 2 L of [place].  Leukocytosis likely related to recent steroid use. - Start Brovana/Pulmicort bid - DuoNebs as needed - IV Solu-Medrol 40 mg twice daily - Continue supplemental oxygen as needed, wean as able - COVID, influenza, RSV negative - Check full respiratory viral panel - Incentive spirometer, flutter valve, Mucinex  CKD stage IV: Creatinine 2.08  on admission, improved from previous 2.72 on 10/13/2023.  Continue to monitor.  Hypertension: Significantly elevated blood pressure on arrival.  Type 2 diabetes: ***  Hyperlipidemia: ***  Chronic pain syndrome: ***  OSA: Continue CPAP nightly.    DVT prophylaxis: ***  Code Status: ***  Family Communication: ***  Disposition Plan: ***  Consults called: ***  Severity of Illness: {Observation/Inpatient:21159}  Jorie Blanch MD [facility]  If 7PM-7AM, please contact night-coverage [email or web address]  11/27/2023, 12:00 AM

## 2023-11-27 NOTE — Hospital Course (Signed)
 Larry Dickson is a 63 y.o. male with medical history significant for HIV, CKD stage IV, T2DM, HTN, HLD, mild intermittent asthma asthma, chronic pain syndrome, OSA on CPAP who is admitted with acute hypoxic respiratory failure due to asthma exacerbation.

## 2023-11-27 NOTE — Plan of Care (Signed)
   Problem: Education: Goal: Knowledge of General Education information will improve Description Including pain rating scale, medication(s)/side effects and non-pharmacologic comfort measures Outcome: Progressing   Problem: Health Behavior/Discharge Planning: Goal: Ability to manage health-related needs will improve Outcome: Progressing

## 2023-11-28 DIAGNOSIS — R7989 Other specified abnormal findings of blood chemistry: Secondary | ICD-10-CM | POA: Diagnosis not present

## 2023-11-28 DIAGNOSIS — J40 Bronchitis, not specified as acute or chronic: Secondary | ICD-10-CM | POA: Diagnosis not present

## 2023-11-28 DIAGNOSIS — J9601 Acute respiratory failure with hypoxia: Secondary | ICD-10-CM | POA: Diagnosis not present

## 2023-11-28 LAB — GLUCOSE, CAPILLARY
Glucose-Capillary: 192 mg/dL — ABNORMAL HIGH (ref 70–99)
Glucose-Capillary: 205 mg/dL — ABNORMAL HIGH (ref 70–99)
Glucose-Capillary: 123 mg/dL — ABNORMAL HIGH (ref 70–99)
Glucose-Capillary: 130 mg/dL — ABNORMAL HIGH (ref 70–99)

## 2023-11-28 LAB — RENAL FUNCTION PANEL
Albumin: 2.9 g/dL — ABNORMAL LOW (ref 3.5–5.0)
Anion gap: 11 (ref 5–15)
BUN: 36 mg/dL — ABNORMAL HIGH (ref 8–23)
CO2: 22 mmol/L (ref 22–32)
Calcium: 9.2 mg/dL (ref 8.9–10.3)
Chloride: 105 mmol/L (ref 98–111)
Creatinine, Ser: 2.3 mg/dL — ABNORMAL HIGH (ref 0.61–1.24)
GFR, Estimated: 31 mL/min — ABNORMAL LOW (ref >60)
Glucose, Bld: 204 mg/dL — ABNORMAL HIGH (ref 70–99)
Phosphorus: 4.4 mg/dL (ref 2.5–4.6)
Potassium: 4 mmol/L (ref 3.5–5.1)
Sodium: 138 mmol/L (ref 135–145)

## 2023-11-28 LAB — CBC
HCT: 38.6 % — ABNORMAL LOW (ref 39.0–52.0)
Hemoglobin: 12.1 g/dL — ABNORMAL LOW (ref 13.0–17.0)
MCH: 29.4 pg (ref 26.0–34.0)
MCHC: 31.3 g/dL (ref 30.0–36.0)
MCV: 93.9 fL (ref 80.0–100.0)
Platelets: 259 10*3/uL (ref 150–400)
RBC: 4.11 MIL/uL — ABNORMAL LOW (ref 4.22–5.81)
RDW: 13.2 % (ref 11.5–15.5)
WBC: 21.5 10*3/uL — ABNORMAL HIGH (ref 4.0–10.5)
nRBC: 0 % (ref 0.0–0.2)

## 2023-11-28 MED ORDER — PREDNISONE 20 MG PO TABS
40.0000 mg | ORAL_TABLET | Freq: Every day | ORAL | Status: DC
  Administered 2023-11-29: 40 mg via ORAL
  Filled 2023-11-28: qty 2

## 2023-11-28 MED ORDER — INSULIN ASPART 100 UNIT/ML IJ SOLN: 7.0000 [IU] | Freq: Three times a day (TID) | INTRAMUSCULAR | Status: DC

## 2023-11-28 MED ORDER — INSULIN ASPART 100 UNIT/ML IJ SOLN
7.0000 [IU] | Freq: Three times a day (TID) | INTRAMUSCULAR | Status: DC
  Administered 2023-11-28 – 2023-11-29 (×3): 7 [IU] via SUBCUTANEOUS

## 2023-11-28 MED ORDER — CARVEDILOL 12.5 MG PO TABS
12.5000 mg | ORAL_TABLET | Freq: Two times a day (BID) | ORAL | Status: DC
  Administered 2023-11-28 – 2023-11-29 (×2): 12.5 mg via ORAL
  Filled 2023-11-28 (×2): qty 1

## 2023-11-28 NOTE — Progress Notes (Signed)
   11/28/23 2041  BiPAP/CPAP/SIPAP  BiPAP/CPAP/SIPAP Pt Type Adult  BiPAP/CPAP/SIPAP Resmed  Mask Type Full face mask  Dentures removed? Not applicable  Mask Size Medium  Respiratory Rate 18 breaths/min  Flow Rate 2 lpm (bled into circuit)  Patient Home Machine No  Patient Home Mask No  Patient Home Tubing No  Auto Titrate Yes  Minimum cmH2O 5 cmH2O  Maximum cmH2O 20 cmH2O  CPAP/SIPAP surface wiped down Yes  Device Plugged into RED Power Outlet Yes  BiPAP/CPAP /SiPAP Vitals  Resp 18  MEWS Score/Color  MEWS Score 0  MEWS Score Color Green

## 2023-11-28 NOTE — Inpatient Diabetes Management (Signed)
 Inpatient Diabetes Program Recommendations  AACE/ADA: New Consensus Statement on Inpatient Glycemic Control (2015)  Target Ranges:  Prepandial:   less than 140 mg/dL      Peak postprandial:   less than 180 mg/dL (1-2 hours)      Critically ill patients:  140 - 180 mg/dL   Lab Results  Component Value Date   GLUCAP 192 (H) 11/28/2023   HGBA1C 5.2 11/27/2023    Review of Glycemic Control  Latest Reference Range & Units 11/26/23 23:28 11/27/23 11:07 11/27/23 16:23 11/27/23 20:56 11/28/23 07:16  Glucose-Capillary 70 - 99 mg/dL 768 (H) 781 (H) 700 (H) 242 (H) 192 (H)  (H): Data is abnormally high  Diabetes history: DM2 Outpatient Diabetes medications: Lantus 60 units BID, Mounjaro 5 mg weekly Current orders for Inpatient glycemic control: Semglee 40 units BID, Novolog  0-15 units TID and 0-5 units at bedtime, Novolog  5 units [facility]  Inpatient Diabetes Program Recommendations:    If postprandials remain elevated, might consider increasing meal coverage:  Novolog  7 units TID if he consumes at least 50%.  Thank you, Wyvonna Pinal, MSN, CDCES Diabetes Coordinator Inpatient Diabetes Program [phone] (team pager from 8a-5p)

## 2023-11-28 NOTE — Plan of Care (Signed)

## 2023-11-28 NOTE — Progress Notes (Addendum)
 [facility]                                                                              Larry Dickson, is a 63 y.o. male, DOB - Oct 21, 1960, FMW:[id number] Admit date - 11/26/2023    Outpatient Primary MD for the patient is Beverley Norleen NOVAK, MD  LOS - 2  days  Chief Complaint  Patient presents with   Shortness of Breath       Brief summary   Patient is a 63 year old male with HIV, CKD stage IV, T2DM, HTN, hyperlipidemia, mild intermittent asthma, chronic pain syndrome, OSA on CPAP presented to ED with shortness of breath and cough.  Patient reported 4 days prior to admission, developed cough, productive with clear/white sputum with occasional yellow sputum.  He had progressive exertional dyspnea that significantly worsened on the day of admission with associated chills and diaphoresis.  Patient had initially called his PCP and was started on prednisone .  He has not been using his home inhalers regularly.  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  No tobacco, alcohol or drug use. In the ED, BP 203/110, pulse 86, RR 37, temp 99.7 F, O2 sats 92% on room air.  Per EDP O2 sats had dropped to 88% on room air. COVID, RSV, flu negative. CTA chest negative for PE, groundglass attenuation of the lungs with mosaic attenuation pattern seen in the lung bases, nonspecific and most likely due to small airway disease.  Mild mediastinal adenopathy, nonspecific and may be reactive. Patient was admitted for further workup.  Assessment & Plan       Acute hypoxic respiratory failure Acute asthma exacerbation, likely triggered by viral URI Bilateral CAP - Still having coughing, CTA chest negative for PE, showed groundglass attenuation in the lungs with mosaic pattern in the lung bases, nonspecific and most likely due to small airway disease.  -  COVID, flu, RSV negative.  Respiratory virus panel  negative.  Blood cultures negative so far - Wean O2 as tolerated, continue Pulmicort, Brovana, DuoNebs  -Improving, transition to oral prednisone  in a.m.  - Continue IV Zithromax , Rocephin , I-S   Acute on chronic heart failure, bilateral pleural effusions, history of CAD - proBNP 970, mildly elevated troponins.  CTA chest showed no PE, small airway disease, small bilateral pleural effusions -Continue Lasix 40 mg IV twice daily, (outpatient on torsemide 40 mg daily), strict I's and O's and daily weights.   - Weight 240 lbs on admission -> 234 lbs today - Follow 2D echo  CKD stage IV: -Creatinine 2.08 on admission, creatinine improved from previous 2.72 on 10/13/2023.   - Creatinine trended up to 2.3 with IV Lasix diuresis - Continue to monitor creatinine  HIV -Continue Triumeq   Hypertensive urgency: -Significantly elevated  - Continue IV Lasix, amlodipine, Avapro, increased Coreg to 12.5 mg twice daily     Diabetes mellitus type 2, IDDM, uncontrolled with hyperglycemia Follow-up hemoglobin A1c  CBG (last 3)  Recent Labs    11/27/23 2056 11/28/23 0716 11/28/23 1124  GLUCAP 242* 192*  205*  - CBGs uncontrolled due to IV steroids, transitioning to oral prednisone , expected to improve - Continue Semglee 40 units twice daily, increase NovoLog  meal coverage to 7 units 3 times daily AC, SSI   Hyperlipidemia: Continue pravastatin.   OSA: Continue CPAP nightly.   Obesity class II Estimated body mass index is 35.59 kg/m as calculated from the following:   Height as of this encounter: 5' 8 (1.727 m).   Weight as of this encounter: 106.2 kg.  Code Status: Full code DVT Prophylaxis:  heparin injection 5,000 Units Start: 11/27/23 0600   Level of Care: Level of care: Telemetry Medical Family Communication: Updated patient' Disposition Plan:      Remains inpatient appropriate:   Improving, hopefully DC home in next 24 to 48 hours continues to improve with  diuresis   Procedures:    Consultants:   None  Antimicrobials:   Anti-infectives (From admission, onward)    Start     Dose/Rate Route Frequency Ordered Stop   11/27/23 1600  cefTRIAXone  (ROCEPHIN ) 1 g in sodium chloride 0.9 % 100 mL IVPB        1 g 200 mL/hr over 30 Minutes Intravenous Every 24 hours 11/27/23 1051 12/02/23 1559   11/27/23 1600  azithromycin  (ZITHROMAX ) tablet 500 mg        500 mg Oral Daily 11/27/23 1051 12/02/23 1559   11/27/23 1000  abacavir-dolutegravir-lamiVUDine (TRIUMEQ) 600-50-300 MG per tablet 1 tablet        1 tablet Oral Daily 11/26/23 2357     11/26/23 1600  cefTRIAXone  (ROCEPHIN ) 1 g in sodium chloride 0.9 % 100 mL IVPB        1 g 200 mL/hr over 30 Minutes Intravenous  Once 11/26/23 1555 11/26/23 1647   11/26/23 1600  azithromycin  (ZITHROMAX ) 500 mg in sodium chloride 0.9 % 250 mL IVPB        500 mg 250 mL/hr over 60 Minutes Intravenous  Once 11/26/23 1555 11/26/23 1707          Medications  abacavir-dolutegravir-lamiVUDine  1 tablet Oral Daily   amLODipine  10 mg Oral Daily   arformoterol  15 mcg Nebulization BID   azithromycin   500 mg Oral Daily   budesonide (PULMICORT) nebulizer solution  0.25 mg Nebulization BID   carvedilol  6.25 mg Oral BID WC   furosemide  40 mg Intravenous Q12H   guaiFENesin  600 mg Oral BID   heparin  5,000 Units Subcutaneous Q8H   insulin  aspart  0-15 Units Subcutaneous TID WC   insulin  aspart  0-5 Units Subcutaneous QHS   insulin  aspart  7 Units Subcutaneous TID WC   insulin  glargine-yfgn  40 Units Subcutaneous BID   irbesartan  300 mg Oral Daily   linaclotide  290 mcg Oral QAC breakfast   pravastatin  80 mg Oral Daily   [START ON 11/29/2023] predniSONE   40 mg Oral QAC breakfast   sodium chloride flush  3 mL Intravenous Q12H   traZODone  50 mg Oral QHS      Subjective:   Larry Dickson was seen and examined today.  Overall improving sitting up in the chair, still has some shortness of breath and  coughing.  No fever chills, chest pain, dizziness, nausea or vomiting.    Objective:   Vitals:   11/28/23 0500 11/28/23 0536 11/28/23 0819 11/28/23 0847  BP:  (!) 170/96  (!) 167/101  Pulse:  80  84  Resp:  18    Temp:  97.7  F (36.5 C)    TempSrc:  Oral    SpO2:  95% 96% 97%  Weight: 106.2 kg     Height:        Intake/Output Summary (Last 24 hours) at 11/28/2023 1211 Last data filed at 11/28/2023 9096 Gross per 24 hour  Intake 830 ml  Output 1550 ml  Net -720 ml     Wt Readings from Last 3 Encounters:  11/28/23 106.2 kg  01/15/23 105.7 kg  07/04/22 108 kg   Physical Exam General: Alert and oriented x 3, NAD Cardiovascular: S1 S2 clear, RRR.  Respiratory: Diminished breath sound at the bases with scattered wheezing Gastrointestinal: Soft, nontender, nondistended, NBS Ext: no pedal edema bilaterally Neuro: no new deficits Psych: Normal affect    Data Reviewed:  I have personally reviewed following labs    CBC Lab Results  Component Value Date   WBC 21.5 (H) 11/28/2023   RBC 4.11 (L) 11/28/2023   HGB 12.1 (L) 11/28/2023   HCT 38.6 (L) 11/28/2023   MCV 93.9 11/28/2023   MCH 29.4 11/28/2023   PLT 259 11/28/2023   MCHC 31.3 11/28/2023   RDW 13.2 11/28/2023   LYMPHSABS 1.7 11/26/2023   MONOABS 1.1 (H) 11/26/2023   EOSABS 0.4 11/26/2023   BASOSABS 0.1 11/26/2023     Last metabolic panel Lab Results  Component Value Date   NA 138 11/28/2023   K 4.0 11/28/2023   CL 105 11/28/2023   CO2 22 11/28/2023   BUN 36 (H) 11/28/2023   CREATININE 2.30 (H) 11/28/2023   GLUCOSE 204 (H) 11/28/2023   GFRNONAA 31 (L) 11/28/2023   GFRAA >60 03/18/2018   CALCIUM 9.2 11/28/2023   PHOS 4.4 11/28/2023   PROT 8.1 11/26/2023   ALBUMIN 2.9 (L) 11/28/2023   BILITOT 1.2 11/26/2023   ALKPHOS 109 11/26/2023   AST 23 11/26/2023   ALT 20 11/26/2023   ANIONGAP 11 11/28/2023    CBG (last 3)  Recent Labs    11/27/23 2056 11/28/23 0716 11/28/23 1124  GLUCAP 242* 192*  205*      Coagulation Profile: No results for input(s): INR, PROTIME in the last 168 hours.   Radiology Studies: I have personally reviewed the imaging studies  CT Angio Chest PE W and/or Wo Contrast Result Date: 11/26/2023 EXAMINATION: CTA CHEST PE CLINICAL INDICATION: Pulmonary embolism (PE) suspected, high prob TECHNIQUE: This examination was performed according to an angiographic protocol with 3D post-processing. This involves 3D reconstructions, MIPs, volume rendered images and/or shaded surface rendering. One or more of the following dose reduction techniques were used: Automated exposure control, adjustment of the mA and/or kV according to patient size, and/or iterative reconstruction. Unless otherwise specified, incidental findings do not require dedicated imaging follow-up. COMPARISON: No prior exam. FINDINGS: The pulmonary arteries are well-opacified. No intraluminal filling defects are identified. There is no thoracic aortic aneurysm. Mild global cardiac enlargement is present. No pericardial effusion is present. There are small bilateral pleural effusions. Enlarged right paratracheal lymph nodes are present measuring up to 1.7 x 1.6 cm. An anterior mediastinal lymph node is present in the midline measuring 1.7 x 1.4 cm. The thyroid gland has a normal appearance. The esophagus is within normal limits. Groundglass attenuation is present in both lungs with a mosaic attenuation pattern in the lung bases. There is a calcified granuloma in the lingula. Mild bibasilar atelectasis is present. No significant abnormality is identified in the upper abdomen. There is no acute fracture or destructive process. IMPRESSION: 1. No evidence  of pulmonary embolism. 2. Groundglass attenuation in the lungs with a mosaic attenuation pattern in the lung bases. This is nonspecific and is most likely due to small airways disease. 3. Mild mediastinal adenopathy. This is nonspecific and may be reactive. Electronically  signed by: Eddy Oar MD 11/26/2023 03:39 PM EDT RP Workstation: [id number]   DG Chest Portable 1 View Result Date: 11/26/2023 CLINICAL DATA:  Shortness of breath and cough 5 days. No improvement on steroid therapy. EXAM: PORTABLE CHEST 1 VIEW COMPARISON:  07/04/2022 FINDINGS: Lungs are adequately inflated without focal airspace consolidation or effusion. There is minimal prominence of the central perihilar markings which may be due to mild vascular congestion versus acute bronchitic process. Borderline cardiomegaly. Remainder of the exam is unchanged. IMPRESSION: Minimal prominence of the central perihilar markings which may be due to mild vascular congestion versus acute bronchitic process. Electronically Signed   By: Toribio Agreste M.D.   On: 11/26/2023 14:12       Baylor Cortez M.D. [facility] 11/28/2023, 12:11 PM  Available via Epic secure chat 7am-7pm After 7 pm, please refer to night coverage provider listed on amion.

## 2023-11-29 ENCOUNTER — Inpatient Hospital Stay (HOSPITAL_COMMUNITY)

## 2023-11-29 ENCOUNTER — Other Ambulatory Visit (HOSPITAL_COMMUNITY): Payer: Self-pay

## 2023-11-29 DIAGNOSIS — J9601 Acute respiratory failure with hypoxia: Secondary | ICD-10-CM | POA: Diagnosis not present

## 2023-11-29 LAB — RENAL FUNCTION PANEL
Albumin: 2.8 g/dL — ABNORMAL LOW (ref 3.5–5.0)
Anion gap: 12 (ref 5–15)
BUN: 37 mg/dL — ABNORMAL HIGH (ref 8–23)
CO2: 23 mmol/L (ref 22–32)
Calcium: 8.6 mg/dL — ABNORMAL LOW (ref 8.9–10.3)
Chloride: 103 mmol/L (ref 98–111)
Creatinine, Ser: 2.63 mg/dL — ABNORMAL HIGH (ref 0.61–1.24)
GFR, Estimated: 27 mL/min — ABNORMAL LOW (ref >60)
Glucose, Bld: 64 mg/dL — ABNORMAL LOW (ref 70–99)
Phosphorus: 4.2 mg/dL (ref 2.5–4.6)
Potassium: 3.1 mmol/L — ABNORMAL LOW (ref 3.5–5.1)
Sodium: 138 mmol/L (ref 135–145)

## 2023-11-29 LAB — CBC
HCT: 40.2 % (ref 39.0–52.0)
Hemoglobin: 12.6 g/dL — ABNORMAL LOW (ref 13.0–17.0)
MCH: 29 pg (ref 26.0–34.0)
MCHC: 31.3 g/dL (ref 30.0–36.0)
MCV: 92.6 fL (ref 80.0–100.0)
Platelets: 295 10*3/uL (ref 150–400)
RBC: 4.34 MIL/uL (ref 4.22–5.81)
RDW: 13.2 % (ref 11.5–15.5)
WBC: 22.5 10*3/uL — ABNORMAL HIGH (ref 4.0–10.5)
nRBC: 0 % (ref 0.0–0.2)

## 2023-11-29 LAB — GLUCOSE, CAPILLARY
Glucose-Capillary: 114 mg/dL — ABNORMAL HIGH (ref 70–99)
Glucose-Capillary: 72 mg/dL (ref 70–99)

## 2023-11-29 MED ORDER — FLUTICASONE PROPIONATE 50 MCG/ACT NA SUSP: 1.0000 | Freq: Every day | NASAL | Status: DC | PRN

## 2023-11-29 MED ORDER — VALACYCLOVIR HCL 500 MG PO TABS
500.0000 mg | ORAL_TABLET | Freq: Every day | ORAL | Status: DC
  Administered 2023-11-29: 500 mg via ORAL
  Filled 2023-11-29: qty 1

## 2023-11-29 MED ORDER — FLUTICASONE FUROATE-VILANTEROL 200-25 MCG/ACT IN AEPB
1.0000 | INHALATION_SPRAY | Freq: Every day | RESPIRATORY_TRACT | Status: DC
  Filled 2023-11-29: qty 28

## 2023-11-29 MED ORDER — AZITHROMYCIN 250 MG PO TABS
ORAL_TABLET | ORAL | 0 refills | Status: DC
  Filled 2023-11-29: qty 2, 2d supply, fill #0

## 2023-11-29 MED ORDER — FLUTICASONE FUROATE-VILANTEROL 200-25 MCG/ACT IN AEPB
1.0000 | INHALATION_SPRAY | Freq: Every day | RESPIRATORY_TRACT | 0 refills | Status: AC
  Filled 2023-11-29: qty 60, 60d supply, fill #0

## 2023-11-29 MED ORDER — TORSEMIDE 20 MG PO TABS
40.0000 mg | ORAL_TABLET | Freq: Every day | ORAL | Status: DC
  Administered 2023-11-29: 40 mg via ORAL
  Filled 2023-11-29: qty 2

## 2023-11-29 MED ORDER — PREDNISONE 10 MG PO TABS
ORAL_TABLET | ORAL | 0 refills | Status: AC
  Filled 2023-11-29: qty 20, 8d supply, fill #0

## 2023-11-29 MED ORDER — CARVEDILOL 12.5 MG PO TABS
12.5000 mg | ORAL_TABLET | Freq: Two times a day (BID) | ORAL | 0 refills | Status: AC
  Filled 2023-11-29: qty 60, 30d supply, fill #0

## 2023-11-29 NOTE — Progress Notes (Signed)
 Discharge meds deliverd to patient at bedside [facility]

## 2023-11-29 NOTE — Progress Notes (Signed)
 Pt ready for discharge. pt's IV removed. All belongings packed and with patient. AVS printed and reviewed. Educated on home care, medications, and follow up appointments. All questions asked and answered. Medications delivered to bedside. Pt discharged to main lobby by this RN in wheelchair.

## 2023-11-29 NOTE — Discharge Summary (Signed)
 Physician Discharge Summary  Larry Dickson FMW:[id number] DOB: 1960/08/23 DOA: 11/26/2023  PCP: Beverley Norleen NOVAK, MD  Admit date: 11/26/2023 Discharge date: 11/29/2023  Admitted From: Home Disposition: Home  Recommendations for Outpatient Follow-up:  Follow up with PCP in 1-2 weeks Follow-up with pulmonology as scheduled  Home Health: None Equipment/Devices: None  Discharge Condition: Stable CODE STATUS: Full Diet recommendation: Low-salt low-fat low-carb renal diet  Brief/Interim Summary: Patient is a 63 year old male with HIV, CKD stage IV, T2DM, HTN, hyperlipidemia, mild intermittent asthma, chronic pain syndrome, OSA on CPAP presented to ED with shortness of breath and cough.  Patient admitted as above with acute hypoxic respiratory failure likely asthma exacerbation triggered by viral respiratory infection.  Patient has underlying COPD complicating his respiratory status.  Status post treatment patient has improved drastically, no longer on oxygen at rest or with exertion.  Patient did have CTA chest to rule out PE which was negative at intake.  At this time given patient's resolution and return to baseline he is otherwise stable for discharge home.  Follow-up with PCP and pulmonology as scheduled, medication changes as below.     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  - COVID, flu, RSV negative.  Respiratory virus panel negative.  Blood cultures negative - Continue outpatient therapy as below   Chronic heart failure, bilateral pleural effusions, history of CAD - Acute heart failure exacerbation ruled out  - Small bilateral pleural effusions at intake per CT but no other obvious signs of/symptoms - Transition back to home p.o. diuretics; outpatient follow-up with cardiology, repeat echo per their expertise, no indication at this time given patient's euvolemia and   CKD stage IV: - Repeat outpatient labs in the next few weeks, unclear baseline given outpatient labs at 2.98 - only 1 year ago; prior labs 5 years ago WNL.   HIV -Continue Triumeq   Hypertensive urgency: -Resolved on current regimen     Diabetes mellitus type 2, IDDM, well controlled Transient hyperglycemia 2/2 steroid use, resolved A1C 5.2   Hyperlipidemia: Continue pravastatin.   OSA: Continue CPAP nightly.     Obesity class II Body mass index is 35.88 kg/m.    Discharge Instructions  Discharge Instructions     (HEART FAILURE PATIENTS) Call MD:  Anytime you have any of the following symptoms: 1) 3 pound weight gain in 24 hours or 5 pounds in 1 week 2) shortness of breath, with or without a dry hacking cough 3) swelling in the hands, feet or stomach 4) if you have to sleep on extra pillows at night in order to breathe.   Complete by: As directed    Call MD for:  difficulty breathing, headache or visual disturbances   Complete by: As directed    Call  MD for:  extreme fatigue   Complete by: As directed    Call MD for:  hives   Complete by: As directed    Call MD for:  persistant dizziness or light-headedness   Complete by: As directed    Call MD for:  persistant nausea and  vomiting   Complete by: As directed    Call MD for:  severe uncontrolled pain   Complete by: As directed    Call MD for:  temperature >100.4   Complete by: As directed    Diet - low sodium heart healthy   Complete by: As directed    Fluid restrict to   Increase activity slowly   Complete by: As directed       Allergies as of 11/29/2023       Reactions   Dapsone    Other Reaction(s): Other (See Comments) G6PD deficiency   Primaquine    Other Reaction(s): Other (See Comments) G6PD deficiency        Medication List     STOP taking these medications    Breztri Aerosphere 160-9-4.8 MCG/ACT Aero inhaler Generic drug: budesonide-glycopyrrolate-formoterol   budesonide-formoterol 160-4.5 MCG/ACT inhaler Commonly known as: SYMBICORT Replaced by: fluticasone furoate-vilanterol 200-25 MCG/ACT Aepb       TAKE these medications    albuterol  108 (90 Base) MCG/ACT inhaler Commonly known as: VENTOLIN  HFA Inhale 1-2 puffs into the lungs every 6 (six) hours as needed for wheezing or shortness of breath.   albuterol  (5 MG/ML) 0.5% nebulizer solution Commonly known as: PROVENTIL  Take 2.5 mg by nebulization every 6 (six) hours as needed for wheezing or shortness of breath.   amLODipine 10 MG tablet Commonly known as: NORVASC Take 10 mg by mouth daily.   azithromycin  250 MG tablet Commonly known as: ZITHROMAX  Take 1 tablet PO daily until finished   carvedilol 12.5 MG tablet Commonly known as: COREG Take 1 tablet (12.5 mg total) by mouth 2 (two) times daily with a meal.   fenofibrate 145 MG tablet Commonly known as: TRICOR Take 145 mg by mouth daily.   fluticasone 50 MCG/ACT nasal spray Commonly known as: FLONASE Place 1 spray into both nostrils daily as needed for allergies or rhinitis.   fluticasone furoate-vilanterol 200-25 MCG/ACT Aepb Commonly known as: BREO ELLIPTA Inhale 1 puff into the lungs daily. Start taking on: November 30, 2023 Replaces:  budesonide-formoterol 160-4.5 MCG/ACT inhaler   insulin  glargine 100 UNIT/ML injection Commonly known as: LANTUS Inject 60 Units into the skin 2 (two) times daily.   Linzess 290 MCG Caps capsule Generic drug: linaclotide Take 290 mcg by mouth every other day.   methocarbamol  500 MG tablet Commonly known as: ROBAXIN  Take 1 tablet (500 mg total) by mouth every 8 (eight) hours as needed.   Mounjaro 5 MG/0.5ML Pen Generic drug: tirzepatide Inject 5 mg into the skin once a week.   pravastatin 80 MG tablet Commonly known as: PRAVACHOL Take 80 mg by mouth daily.   predniSONE  10 MG tablet Commonly known as: DELTASONE  Take 4 tablets (40 mg total) by mouth daily for 2 days, THEN 3 tablets (30 mg total) daily for 2 days, THEN 2 tablets (20 mg total) daily for 2 days, THEN 1 tablet (10 mg total) daily for 2 days. Start taking on: November 29, 2023 What changed:  medication strength See the new instructions.   telmisartan 80 MG tablet Commonly known as: MICARDIS Take 80 mg by mouth daily.   torsemide 20 MG tablet Commonly known  as: DEMADEX Take 40 mg by mouth daily.   traMADol  50 MG tablet Commonly known as: ULTRAM  Take 1 tablet (50 mg total) by mouth every 6 (six) hours as needed.   Triumeq 600-50-300 MG tablet Generic drug: abacavir-dolutegravir-lamiVUDine Take 1 tablet by mouth daily.   valACYclovir 500 MG tablet Commonly known as: VALTREX Take 500 mg by mouth daily.        Allergies  Allergen Reactions   Dapsone     Other Reaction(s): Other (See Comments)  G6PD deficiency   Primaquine     Other Reaction(s): Other (See Comments)  G6PD deficiency    Consultations: None   Procedures/Studies: CT Angio Chest PE W and/or Wo Contrast Result Date: 11/26/2023 EXAMINATION: CTA CHEST PE CLINICAL INDICATION: Pulmonary embolism (PE) suspected, high prob TECHNIQUE: This examination was performed according to an angiographic protocol with 3D post-processing. This involves  3D reconstructions, MIPs, volume rendered images and/or shaded surface rendering. One or more of the following dose reduction techniques were used: Automated exposure control, adjustment of the mA and/or kV according to patient size, and/or iterative reconstruction. Unless otherwise specified, incidental findings do not require dedicated imaging follow-up. COMPARISON: No prior exam. FINDINGS: The pulmonary arteries are well-opacified. No intraluminal filling defects are identified. There is no thoracic aortic aneurysm. Mild global cardiac enlargement is present. No pericardial effusion is present. There are small bilateral pleural effusions. Enlarged right paratracheal lymph nodes are present measuring up to 1.7 x 1.6 cm. An anterior mediastinal lymph node is present in the midline measuring 1.7 x 1.4 cm. The thyroid gland has a normal appearance. The esophagus is within normal limits. Groundglass attenuation is present in both lungs with a mosaic attenuation pattern in the lung bases. There is a calcified granuloma in the lingula. Mild bibasilar atelectasis is present. No significant abnormality is identified in the upper abdomen. There is no acute fracture or destructive process. IMPRESSION: 1. No evidence of pulmonary embolism. 2. Groundglass attenuation in the lungs with a mosaic attenuation pattern in the lung bases. This is nonspecific and is most likely due to small airways disease. 3. Mild mediastinal adenopathy. This is nonspecific and may be reactive. Electronically signed by: Eddy Oar MD 11/26/2023 03:39 PM EDT RP Workstation: [id number]   DG Chest Portable 1 View Result Date: 11/26/2023 CLINICAL DATA:  Shortness of breath and cough 5 days. No improvement on steroid therapy. EXAM: PORTABLE CHEST 1 VIEW COMPARISON:  07/04/2022 FINDINGS: Lungs are adequately inflated without focal airspace consolidation or effusion. There is minimal prominence of the central perihilar markings which may be due to  mild vascular congestion versus acute bronchitic process. Borderline cardiomegaly. Remainder of the exam is unchanged. IMPRESSION: Minimal prominence of the central perihilar markings which may be due to mild vascular congestion versus acute bronchitic process. Electronically Signed   By: Toribio Agreste M.D.   On: 11/26/2023 14:12     Subjective: No acute issues or events overnight, respiratory status back to baseline   Discharge Exam: Vitals:   11/29/23 0821 11/29/23 1221  BP: (!) 160/89 (!) 142/91  Pulse: 76 70  Resp:    Temp:  97.8 F (36.6 C)  SpO2:  93%   Vitals:   11/28/23 2140 11/29/23 0616 11/29/23 0821 11/29/23 1221  BP: (!) 160/93 (!) 146/96 (!) 160/89 (!) 142/91  Pulse: 80 68 76 70  Resp: 20 20    Temp: 97.9 F (36.6 C) 97.9 F (36.6 C)  97.8 F (36.6 C)  TempSrc:    Oral  SpO2: 91% 92%  93%  Weight:  107 kg    Height:        General: Pt is alert, awake, not in acute distress Cardiovascular: RRR, S1/S2 +, no rubs, no gallops Respiratory: CTA bilaterally, no wheezing, no rhonchi Abdominal: Soft, NT, ND, bowel sounds + Extremities: no edema, no cyanosis   The results of significant diagnostics from this hospitalization (including imaging, microbiology, ancillary and laboratory) are listed below for reference.     Microbiology: Recent Results (from the past 240 hours)  Blood culture (routine x 2)     Status: None (Preliminary result)   Collection Time: 11/26/23  1:57 PM   Specimen: BLOOD  Result Value Ref Range Status   Specimen Description   Final    BLOOD LEFT ANTECUBITAL Performed at [facility], [address] [place], [place] [address]    Special Requests   Final    BOTTLES DRAWN AEROBIC AND ANAEROBIC Blood Culture adequate volume Performed at [facility], [address] [place], [place] [address]    Culture   Final    NO GROWTH 3 DAYS Performed at [facility], [address], [place], [place] [address]    Report  Status PENDING  Incomplete  Resp panel by RT-PCR (RSV, Flu A&B, Covid) Anterior Nasal Swab     Status: None   Collection Time: 11/26/23  1:59 PM   Specimen: Anterior Nasal Swab  Result Value Ref Range Status   SARS Coronavirus 2 by RT PCR NEGATIVE NEGATIVE Final    Comment: (NOTE) SARS-CoV-2 target nucleic acids are NOT DETECTED.  The SARS-CoV-2 RNA is generally detectable in upper respiratory specimens during the acute phase of infection. The lowest concentration of SARS-CoV-2 viral copies this assay can detect is 138 copies/mL. A negative result does not preclude SARS-Cov-2 infection and should not be used as the sole basis for treatment or other patient management decisions. A negative result may occur with  improper specimen collection/handling, submission of specimen other than nasopharyngeal swab, presence of viral mutation(s) within the areas targeted by this assay, and inadequate number of viral copies(<138 copies/mL). A negative result must be combined with clinical observations, patient history, and epidemiological information. The expected result is Negative.  Fact Sheet for Patients:  BloggerCourse.com  Fact Sheet for Healthcare Providers:  SeriousBroker.it  This test is no t yet approved or cleared by the [place]  FDA and  has been authorized for detection and/or diagnosis of SARS-CoV-2 by FDA under an Emergency Use Authorization (EUA). This EUA will remain  in effect (meaning this test can be used) for the duration of the COVID-19 declaration under Section 564(b)(1) of the Act, 21 U.S.C.section 360bbb-3(b)(1), unless the authorization is terminated  or revoked sooner.       Influenza A by PCR NEGATIVE NEGATIVE Final   Influenza B by PCR NEGATIVE NEGATIVE Final    Comment: (NOTE) The Xpert Xpress SARS-CoV-2/FLU/RSV plus assay is intended as an aid in the diagnosis of influenza from Nasopharyngeal swab  specimens and should not be used as a sole basis for treatment. Nasal washings and aspirates are unacceptable for Xpert Xpress SARS-CoV-2/FLU/RSV testing.  Fact Sheet for Patients: BloggerCourse.com  Fact Sheet for Healthcare Providers: SeriousBroker.it  This test is not yet approved or cleared by the [place]  FDA and has been authorized for detection and/or diagnosis of SARS-CoV-2 by FDA under an Emergency Use Authorization (EUA). This EUA will remain in effect (meaning this test  can be used) for the duration of the COVID-19 declaration under Section 564(b)(1) of the Act, 21 U.S.C. section 360bbb-3(b)(1), unless the authorization is terminated or revoked.     Resp Syncytial Virus by PCR NEGATIVE NEGATIVE Final    Comment: (NOTE) Fact Sheet for Patients: BloggerCourse.com  Fact Sheet for Healthcare Providers: SeriousBroker.it  This test is not yet approved or cleared by the [place]  FDA and has been authorized for detection and/or diagnosis of SARS-CoV-2 by FDA under an Emergency Use Authorization (EUA). This EUA will remain in effect (meaning this test can be used) for the duration of the COVID-19 declaration under Section 564(b)(1) of the Act, 21 U.S.C. section 360bbb-3(b)(1), unless the authorization is terminated or revoked.  Performed at [facility], [address] [place], [place] [address]   Blood culture (routine x 2)     Status: None (Preliminary result)   Collection Time: 11/26/23  2:02 PM   Specimen: BLOOD  Result Value Ref Range Status   Specimen Description   Final    BLOOD RIGHT ANTECUBITAL Performed at [facility], [address] [place], [place] [address]    Special Requests   Final    BOTTLES DRAWN AEROBIC AND ANAEROBIC Blood Culture results may not be optimal due to an inadequate volume of blood received in culture  bottles Performed at [facility], [address] [place], [place] [address]    Culture   Final    NO GROWTH 3 DAYS Performed at [facility], [address], [place], [place] [address]    Report Status PENDING  Incomplete  Respiratory (~20 pathogens) panel by PCR     Status: None   Collection Time: 11/27/23  2:15 AM   Specimen: Nasopharyngeal Swab; Respiratory  Result Value Ref Range Status   Adenovirus NOT DETECTED NOT DETECTED Final   Coronavirus 229E NOT DETECTED NOT DETECTED Final    Comment: (NOTE) The Coronavirus on the Respiratory Panel, DOES NOT test for the novel  Coronavirus (2019 nCoV)    Coronavirus HKU1 NOT DETECTED NOT DETECTED Final   Coronavirus NL63 NOT DETECTED NOT DETECTED Final   Coronavirus OC43 NOT DETECTED NOT DETECTED Final   Metapneumovirus NOT DETECTED NOT DETECTED Final   Rhinovirus / Enterovirus NOT DETECTED NOT DETECTED Final   Influenza A NOT DETECTED NOT DETECTED Final   Influenza B NOT DETECTED NOT DETECTED Final   Parainfluenza Virus 1 NOT DETECTED NOT DETECTED Final   Parainfluenza Virus 2 NOT DETECTED NOT DETECTED Final   Parainfluenza Virus 3 NOT DETECTED NOT DETECTED Final   Parainfluenza Virus 4 NOT DETECTED NOT DETECTED Final   Respiratory Syncytial Virus NOT DETECTED NOT DETECTED Final   Bordetella pertussis NOT DETECTED NOT DETECTED Final   Bordetella Parapertussis NOT DETECTED NOT DETECTED Final   Chlamydophila pneumoniae NOT DETECTED NOT DETECTED Final   Mycoplasma pneumoniae NOT DETECTED NOT DETECTED Final    Comment: Performed at [facility], [address] [place], [place] [address]     Labs: BNP (last 3 results) No results for input(s): BNP in the last 8760 hours. Basic Metabolic Panel: Recent Labs  Lab 11/26/23 1359 11/27/23 0556 11/28/23 0522 11/29/23 0537  NA 140 137 138 138  K 3.8 3.6 4.0 3.1*  CL 103 103 105 103  CO2 23 24 22 23   GLUCOSE 117* 171* 204* 64*  BUN 16 22 36* 37*   CREATININE 2.08* 2.16* 2.30* 2.63*  CALCIUM  9.4 8.9 9.2 8.6*  PHOS  --   --  4.4 4.2   Liver Function Tests: Recent Labs  Lab 11/26/23 1359 11/28/23 0522 11/29/23 0537  AST 23  --   --   ALT 20  --   --   ALKPHOS 109  --   --   BILITOT 1.2  --   --   PROT 8.1  --   --   ALBUMIN 3.9 2.9* 2.8*   No results for input(s): LIPASE, AMYLASE in the last 168 hours. No results for input(s): AMMONIA in the last 168 hours. CBC: Recent Labs  Lab 11/26/23 1359 11/27/23 0556 11/28/23 0522 11/29/23 0537  WBC 16.2* 17.0* 21.5* 22.5*  NEUTROABS 12.8*  --   --   --   HGB 12.6* 11.9* 12.1* 12.6*  HCT 39.3 38.3* 38.6* 40.2  MCV 88.3 92.7 93.9 92.6  PLT 288 264 259 295   Cardiac Enzymes: No results for input(s): CKTOTAL, CKMB, CKMBINDEX, TROPONINI in the last 168 hours. BNP: Invalid input(s): POCBNP CBG: Recent Labs  Lab 11/28/23 1124 11/28/23 1624 11/28/23 2134 11/29/23 0719 11/29/23 1131  GLUCAP 205* 123* 130* 114* 72   D-Dimer No results for input(s): DDIMER in the last 72 hours. Hgb A1c Recent Labs    11/27/23 0556  HGBA1C 5.2   Lipid Profile No results for input(s): CHOL, HDL, LDLCALC, TRIG, CHOLHDL, LDLDIRECT in the last 72 hours. Thyroid function studies No results for input(s): TSH, T4TOTAL, T3FREE, THYROIDAB in the last 72 hours.  Invalid input(s): FREET3 Anemia work up No results for input(s): VITAMINB12, FOLATE, FERRITIN, TIBC, IRON, RETICCTPCT in the last 72 hours. Urinalysis No results found for: COLORURINE, APPEARANCEUR, LABSPEC, PHURINE, GLUCOSEU, HGBUR, BILIRUBINUR, KETONESUR, PROTEINUR, UROBILINOGEN, NITRITE, LEUKOCYTESUR Sepsis Labs Recent Labs  Lab 11/26/23 1359 11/27/23 0556 11/28/23 0522 11/29/23 0537  WBC 16.2* 17.0* 21.5* 22.5*   Microbiology Recent Results (from the past 240 hours)  Blood culture (routine x 2)     Status: None (Preliminary result)   Collection  Time: 11/26/23  1:57 PM   Specimen: BLOOD  Result Value Ref Range Status   Specimen Description   Final    BLOOD LEFT ANTECUBITAL Performed at [facility], [address] [place], [place] [address]    Special Requests   Final    BOTTLES DRAWN AEROBIC AND ANAEROBIC Blood Culture adequate volume Performed at [facility], [address] [place], [place] [address]    Culture   Final    NO GROWTH 3 DAYS Performed at [facility], [address], [place], [place] [address]    Report Status PENDING  Incomplete  Resp panel by RT-PCR (RSV, Flu A&B, Covid) Anterior Nasal Swab     Status: None   Collection Time: 11/26/23  1:59 PM   Specimen: Anterior Nasal Swab  Result Value Ref Range Status   SARS Coronavirus 2 by RT PCR NEGATIVE NEGATIVE Final    Comment: (NOTE) SARS-CoV-2 target nucleic acids are NOT DETECTED.  The SARS-CoV-2 RNA is generally detectable in upper respiratory specimens during the acute phase of infection. The lowest concentration of SARS-CoV-2 viral copies this assay can detect is 138 copies/mL. A negative result does not preclude SARS-Cov-2 infection and should not be used as the sole basis for treatment or other patient management decisions. A negative result may occur with  improper specimen collection/handling, submission of specimen other than nasopharyngeal swab, presence of viral mutation(s) within the areas targeted by  this assay, and inadequate number of viral copies(<138 copies/mL). A negative result must be combined with clinical observations, patient history, and epidemiological information. The expected result is Negative.  Fact Sheet for Patients:  BloggerCourse.com  Fact Sheet for Healthcare Providers:  SeriousBroker.it  This test is no t yet approved or cleared by the [place]  FDA and  has been authorized for detection and/or diagnosis of SARS-CoV-2 by FDA under an  Emergency Use Authorization (EUA). This EUA will remain  in effect (meaning this test can be used) for the duration of the COVID-19 declaration under Section 564(b)(1) of the Act, 21 U.S.C.section 360bbb-3(b)(1), unless the authorization is terminated  or revoked sooner.       Influenza A by PCR NEGATIVE NEGATIVE Final   Influenza B by PCR NEGATIVE NEGATIVE Final    Comment: (NOTE) The Xpert Xpress SARS-CoV-2/FLU/RSV plus assay is intended as an aid in the diagnosis of influenza from Nasopharyngeal swab specimens and should not be used as a sole basis for treatment. Nasal washings and aspirates are unacceptable for Xpert Xpress SARS-CoV-2/FLU/RSV testing.  Fact Sheet for Patients: BloggerCourse.com  Fact Sheet for Healthcare Providers: SeriousBroker.it  This test is not yet approved or cleared by the [place]  FDA and has been authorized for detection and/or diagnosis of SARS-CoV-2 by FDA under an Emergency Use Authorization (EUA). This EUA will remain in effect (meaning this test can be used) for the duration of the COVID-19 declaration under Section 564(b)(1) of the Act, 21 U.S.C. section 360bbb-3(b)(1), unless the authorization is terminated or revoked.     Resp Syncytial Virus by PCR NEGATIVE NEGATIVE Final    Comment: (NOTE) Fact Sheet for Patients: BloggerCourse.com  Fact Sheet for Healthcare Providers: SeriousBroker.it  This test is not yet approved or cleared by the [place]  FDA and has been authorized for detection and/or diagnosis of SARS-CoV-2 by FDA under an Emergency Use Authorization (EUA). This EUA will remain in effect (meaning this test can be used) for the duration of the COVID-19 declaration under Section 564(b)(1) of the Act, 21 U.S.C. section 360bbb-3(b)(1), unless the authorization is terminated or revoked.  Performed at [facility], [address] [place], [place] [address]   Blood culture (routine x 2)     Status: None (Preliminary result)   Collection Time: 11/26/23  2:02 PM   Specimen: BLOOD  Result Value Ref Range Status   Specimen Description   Final    BLOOD RIGHT ANTECUBITAL Performed at [facility], [address] [place], [place] [address]    Special Requests   Final    BOTTLES DRAWN AEROBIC AND ANAEROBIC Blood Culture results may not be optimal due to an inadequate volume of blood received in culture bottles Performed at [facility], [address] [place], [place] [address]    Culture   Final    NO GROWTH 3 DAYS Performed at [facility], [address], [place], [place] [address]    Report Status PENDING  Incomplete  Respiratory (~20 pathogens) panel by PCR     Status: None   Collection Time: 11/27/23  2:15 AM   Specimen: Nasopharyngeal Swab; Respiratory  Result Value Ref Range Status   Adenovirus NOT DETECTED NOT DETECTED Final   Coronavirus 229E NOT DETECTED NOT DETECTED Final    Comment: (NOTE) The Coronavirus on the Respiratory Panel, DOES NOT test for the novel  Coronavirus (2019 nCoV)    Coronavirus HKU1 NOT DETECTED  NOT DETECTED Final   Coronavirus NL63 NOT DETECTED NOT DETECTED Final   Coronavirus OC43 NOT DETECTED NOT DETECTED Final   Metapneumovirus NOT DETECTED NOT DETECTED Final   Rhinovirus / Enterovirus NOT DETECTED NOT DETECTED Final   Influenza A NOT DETECTED NOT DETECTED Final   Influenza B NOT DETECTED NOT DETECTED Final   Parainfluenza Virus 1 NOT DETECTED NOT DETECTED Final   Parainfluenza Virus 2 NOT DETECTED NOT DETECTED Final   Parainfluenza Virus 3 NOT DETECTED NOT DETECTED Final   Parainfluenza Virus 4 NOT DETECTED NOT DETECTED Final   Respiratory Syncytial Virus NOT DETECTED NOT DETECTED Final   Bordetella pertussis NOT DETECTED NOT DETECTED Final   Bordetella Parapertussis NOT DETECTED NOT DETECTED Final   Chlamydophila  pneumoniae NOT DETECTED NOT DETECTED Final   Mycoplasma pneumoniae NOT DETECTED NOT DETECTED Final    Comment: Performed at [facility], [address] [place], [place] [address]     Time coordinating discharge: Over 30 minutes  SIGNED:   Elsie JAYSON Montclair, DO [facility] 11/29/2023, 12:29 PM Pager   If 7PM-7AM, please contact night-coverage [email or web address]

## 2023-11-29 NOTE — Progress Notes (Signed)
 Mobility Specialist - Progress Note   11/29/23 1100  Mobility  Activity Ambulated with assistance in hallway  Level of Assistance Modified independent, requires aide device or extra time  Assistive Device None  Distance Ambulated (ft) 300 ft  Range of Motion/Exercises Active  Activity Response Tolerated well  Mobility Referral Yes  Mobility visit 1 Mobility  Mobility Specialist Start Time (ACUTE ONLY) 1012  Mobility Specialist Stop Time (ACUTE ONLY) 1025  Mobility Specialist Time Calculation (min) (ACUTE ONLY) 13 min   Received in bed and agreed to mobility. Had no issues throughout session, no desaturation noted throughout session. Returned to chair with all needs met.  Cyndee Ada Mobility Specialist

## 2023-11-29 NOTE — Plan of Care (Signed)

## 2023-12-01 LAB — CULTURE, BLOOD (ROUTINE X 2)
Culture: NO GROWTH
Culture: NO GROWTH
Special Requests: ADEQUATE

## 2024-01-23 NOTE — Progress Notes (Signed)
 [facility] [place] [address] [place] [address] Dept: [phone]  Clinical Visit Note     Referring physician: Modesto Beverlee GAILS, MD [address] [place],  [place] [address]  ASSESSMENT/PLAN  1. 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 Drusen of macula, bilateral      4. 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 OSA (obstructive sleep apnea)      6. Human immunodeficiency virus (HIV) disease (HCC)      7. PVD (posterior vitreous detachment), right eye      8. Severe obesity (BMI 35.0-39.9) with comorbidity (CMS/HCC)        Return in about 3 months (around 04/24/2024) for DEE, OCT PPOLE, IVA OD vs OS.    Patient Instructions  Worsening in diabetic macular edema in left eye - swelling in central retin requires treatment with injection.   Proceeding with IVA OS today. Keep eye shield for 1 hr, then remove. Use lubricating drops and/or Erythromycin ointment as needed for any irritation, discomfort, foreign body sensation. Call with any pain, redness, light sensitivity, worsening vision, or flashes of light.  In right eye leakage wo significant thickening; will continue monitoring. It is critical to keep blood sugar under control.  Continue CPAP.  Drusen - stable in both eyes      Assessment & Plan    Ophthalmic Meds Ordered this visit:   Orders Placed This Encounter  Medications  . bevacizumab (AVASTIN) intravitreal syringe 1.25 mg    This is a high-cost/low-use medication   [employer]: Must obtain  from the central repository at [facility].  M-F 8am-4pm [phone]   M-F 4pm-8am, Sa/Su [phone]   [place]: If not stocked, contact Purchasing Team immediately.  May take up to 48h to obtain.     CHIEF COMPLAINT Patient presents for Procedure Visit and Diabetic Eye Exam   HISTORY OF PRESENT ILLNESS: Larry Dickson is a 63 y.o. old male who presents to the clinic today for:   HPI     Procedure Visit           Provider Attestation: the attending physician, performed the HPI with the patient and updated the documentation appropriately   Planned Procedure:: Possible 7 month IVA OD   Interval Changes:: none         Diabetic Eye Exam           Provider Attestation: the attending physician, performed the HPI with the patient and updated the documentation appropriately   Visit Type:: a return visit   Diabetes Treatment:: oral medications and insulin  (Jardiance, Lantus , Janumet, Mounjaro)   Diabetes Duration:: years   Last A1C:: 5.2 (11/27/23)   Symptoms:: stable vision and floaters (Blood Sugar This Morning: 110 Prior Diabetic Retinopathy: YES Past Injections/LASER/Retinal Surgeries: Focal LASER 03/20/2023, IVA OD FHx of DM: mother, father, sister, all four grandparents )       Last edited by Modesto Beverlee GAILS, MD on 01/23/2024 12:39 PM.      History of  Present Illness    Last A1C:  HX HEMOGLOBIN A1C  Date Value Ref Range Status  09/21/2021 7.6 (H) <5.8 % Final    Comment:    The reference ranges follow the ADA recommendations:     - Normal A1c: Less than 5.8%     - Prediabetes range A1c: 5.8-6.4%     - Diabetes range A1c: Above 6.4%    HX A1C COMMENT  Date Value Ref Range Status  09/21/2021   Final    Comment:    Presence of heterozygote variants in patients' samples do not interfere with accurate measurements of HbA1c using Trinity affinity boronate chromatography, when no other clinical conditions affecting  quality/quantity of HbA and/or quantity of hemoglobin, overall, as well as quality/quantity of RBCs are concurrently present.   Presence of homozygote variants and/or other medical conditions affecting the quality/quantity of HbA (e.g. alpha- and beta-thalassemia) and/or quantity of hemoglobin, overall, as well as quality/quantity of RBCs (e.g. anemia of any cause) trigger inaccurate evaluations of the glycated HbA1c with any analytical method, including Trinity affinity boronate chromatography.  In these circumstances, fructosamine testing for these patients is recommended.  Hb F present at concentrations of 11% or above can interfere with accurate measurements of HbA1c.  In these circumstances, fructosamine testing for these patients is recommended.     HISTORICAL INFORMATION:   PROBLEM LIST: Patient Active Problem List   Diagnosis Date Noted   . Severe obesity (BMI 35.0-39.9) with comorbidity (CMS/HCC) 10/19/2023  . Type 2 diabetes mellitus with left eye affected by mild nonproliferative retinopathy without macular edema, with long-term current use of insulin  (HCC) 07/06/2022  . Type 2 diabetes mellitus with right eye affected by mild nonproliferative retinopathy and macular edema, with long-term current use of insulin  (HCC) 07/06/2022  . Drusen of macula, 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  . Nausea 07/06/2022    Overview Note:    FSBS 153; BP 130/90 in office     . 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  . Hypertension associated with diabetes (HCC)   . Mild intermittent asthma without complication   . Type 2 diabetes mellitus with hyperglycemia, with long-term current use of insulin  (HCC)   . Hyperlipidemia associated with type 2 diabetes mellitus (HCC)   . OSA (obstructive sleep apnea)   . Chronic pain syndrome 03/25/2013  . Proteinuria 03/25/2013    Overview Note:    10/2017: Prior evaluation by nephrology    . Insomnia 09/15/2011  . Human immunodeficiency virus (HIV) disease  (HCC) 05/03/2011    Overview Note:    10/2017: Under care of infectious disease      Resolved Problems  No resolved problems to display.    CURRENT MEDICATIONS: Current Medications[1]  REVIEW OF SYSTEMS  ALLERGIES Allergies[2]  PAST MEDICAL HISTORY Medical History[3] Surgical History[4]  FAMILY HISTORY Family History[5]  SOCIAL HISTORY Social History[6]       OPHTHALMIC EXAM:  Base Eye Exam     Visual Acuity (Snellen - Linear)       Right Left   Dist [place] 20/25 20/25   [place] j2 ou          Tonometry (Applanation, 9:19 AM)       Right Left   Pressure 16 16         Max Pressure       Right Left   Max 20 (10/17/2023) 20 (10/17/2023)         Pachymetry (08/13/2021)       Right Left  Thickness 480 478         Pupils       Pupils   Right PERRL   Left PERRL         Visual Fields (Counting fingers)       Left Right    Full Full         Extraocular Movement       Right Left    Full Full         Neuro/Psych     Oriented x3: Yes   Mood/Affect: Normal         Dilation     Both eyes: 2.5% Phenylephrine, 0.5% Proparacaine @ 9:19 AM           Slit Lamp and Fundus Exam     External Exam       Right Left   External Normal Normal         Slit Lamp Exam       Right Left   Lids/Lashes Normal Normal   Conjunctiva/Sclera White and quiet White and quiet   Cornea Clear Clear   Anterior Chamber Deep and quiet Deep and quiet   Iris Round and reactive Round and reactive   Lens 1+ NS, 3+ Cort 1+ NS, 3+ Cort   Anterior Vitreous Normal Normal         Fundus Exam       Right Left   Posterior Vitreous PVD, Weiss ring - new Normal   Disc Normal Normal   C/D Ratio 0.3 0.4   Macula central drusen; no retinal thickening at IT arcade CI DME - new, MAs, Non central drusen;   Vessels scatterd BDH, MAs, no CWS along ST arcade scattered BDH, MAs OS>OD, no CWS along ST arcade   Periphery lattice with hole and bridging  vessel ST Normal           Refraction     Manifest Refraction       Sphere Cylinder Axis Dist VA Add Near [place]   Right +0.50 -1.00 105 20/25+1 +2.50 j1+ ou   Left +1.00 -0.75 120 20/25 +2.50   Patient feels manifest is similar to uncorrected vision           IMAGING AND PROCEDURES:  OCT, Macula - OU - Both Eyes       Optical Coherence Tomography Right Eye  Diagnosis: NPDR/DME  Patient Cooperation: good.  Technically good study. Blink Artifact: No.  Decreased Signal Strength: No  Central Foveal Thickness: 227 (+2/+69mM)  Findings: (Central and non central drusen; no retinal thickening, IRF and HE present at IT arcade; interval PVD ).  Comparison/Progression: OCT findings are stable.  Impression: same as initial diagnosis.   Left Eye  Diagnosis: NPDR  Patient Cooperation: good.  Technically good study. Blink Artifact: No.  Decreased Signal Strength: No  Central Foveal Thickness: 222 (+4/+50mM)  Findings: (Large non central drusen, no SRF; IRF in FAZ temp- worse ; irregular layer anatomy/DRIL; IR HE, IRF present at IT arcade- worse;).  Comparison/Progression: OCT findings have worsened.  Impression: same as initial diagnosis.      Intravitreal Injection, Pharmacologic Agent - OS - Left Eye       Time Out 01/23/2024. Time out was performed identifying the correct patient, eye, and procedure.   Procedure Information  Procedure: Injection of this intravitreal medication # 1   Pre-operative Diagnosis: DME/NPDR.   Postoperative Diagnosis: same.   Anesthesia: Subconjunctival Lidocaine    Surgeon: DIONE   The patient was  brought to the minor surgery room. Informed consent had already been obtained for intravitreal injection of medication The risks of the procedure including potential systemic risks like stroke and heart attack and other thrombotic events as well as the local risks like infection, endophthalmitis, retinal detachment and cataract were discussed. The  patient consented to have intravitreal injection.   The patient was placed in the supine position. A lid speculum was placed to expose the eye. Subconjunctival Lidocaine  anesthetic was given prior to placing the speculum. superotemperal quadrant giving adequate anesthesia. The area was then cleaned extensively with Betadine swabs and allowed to dry. At this time, 3.5 mm posterior to the limbus utilizing a 30 gauge needle, medication was injected into the vitreous cavity under direct visualization. The needle was then withdrawn from the eye and the retina was visualized. The central retinal artery was patent. The medication was noted to be in the vitreous cavity.   Injection: 1.25 mg bevacizumab 2.75 mg/0.11 mL   Route: intravitreal, Site: Left Eye   NDC: [id number], Lot: [id number], Expiration date: 02/26/2024, Waste: 0.06 mL    Total Units Wasted: none. none Discharge Instructions were reviewed.          Explained the diagnoses, plan, and follow up with the patient and they expressed understanding.  Patient expressed understanding of the importance of proper follow up care.    Abbreviations: M myopia (nearsighted); A astigmatism; AT artificial tears; H hyperopia (farsighted); P presbyopia; MRx spectacle prescription;  CTL contact lenses; OD right eye; OS left eye; OU both eyes  XT exotropia; ET esotropia; PEK punctate epithelial keratitis; PEE punctate epithelial erosions; DES dry eye syndrome; MGD meibomian gland dysfunction; ATs artificial tears; PFATs preservative free artificial tears; NSC nuclear sclerotic cataract; PSC posterior subcapsular cataract; ERM epi-retinal membrane; PVD posterior vitreous detachment; RD retinal detachment; DM diabetes mellitus; DR diabetic retinopathy; NPDR non-proliferative diabetic retinopathy; PDR proliferative diabetic retinopathy; CSME clinically significant macular edema; DME diabetic macular edema; DBH dot blot hemorrhages; MA micro aneurism; CWS cotton  wool spot; POAG primary open angle glaucoma; C/D cup-to-disc ratio; HVF humphrey visual field; GVF goldmann visual field; OCT optical coherence tomography; IOP intraocular pressure; BRVO Branch retinal vein occlusion; CRVO central retinal vein occlusion; CRAO central retinal artery occlusion; BRAO branch retinal artery occlusion; RT retinal tear;  SB scleral buckle; PPV pars plana vitrectomy; VH Vitreous hemorrhage; PRP panretinal laser photocoagulation; IVK intravitreal kenalog; VMT vitreomacular traction; MH Macular hole;  NVD neovascularization of the disc; NVE neovascularization elsewhere; AREDS age related eye disease study; ARMD age related macular degeneration; POAG primary open angle glaucoma; EBMD epithelial/anterior basement membrane dystrophy; ACIOL anterior chamber intraocular lens; IOL intraocular lens; PCIOL posterior chamber intraocular lens; Phaco/IOL phacoemulsification with intraocular lens placement; PRK photorefractive keratectomy; LASIK laser assisted in situ keratomileusis; HTN hypertension; DM diabetes mellitus; COPD chronic obstructive pulmonary disease   Electronically signed by: Yulia V Radionchenko, MD 01/23/2024 12:48 PM        [1] Current Outpatient Medications  Medication Sig Dispense Refill  . abacavir -dolutegravir -lamiVUDine  (Triumeq ) 600-50-300 mg per tablet TAKE 1 TABLET BY MOUTH DAILY. 30 tablet 10  . albuterol  HFA (PROVENTIL  HFA;VENTOLIN  HFA;PROAIR  HFA) 90 mcg/actuation inhaler     . amLODIPine  (NORVASC ) 10 mg tablet Take 10 mg by mouth Once Daily.    . benzonatate (TESSALON) 100 mg capsule Take 1 capsule (100 mg total) by mouth 3 (three) times a day as needed for cough. 30 capsule 1  . Breztri Aerosphere 160-9-4.8 mcg/actuation HFAA     .  budesonide -formoteroL (Symbicort) 80-4.5 mcg/actuation inhaler Inhale 2 puffs 2 (two) times a day.    . carvediloL  (COREG ) 6.25 mg tablet TAKE 1 TABLET BY MOUTH TWICE DAILY WITH A MEAL/FOOD    . diclofenac  sodium (VOLTAREN ) 1 %  gel Apply topically.    SABRA doxycycline (VIBRAMYCIN) 100 mg capsule     . erythromycin (ROMYCIN) 5 mg/gram (0.5 %) ophthalmic ointment For use as needed in either eye following injection. 3.5 g 11  . esomeprazole (NexIUM) 40 mg DR capsule TAKE 1 CAPSULE BY MOUTH EVERY DAY 30 capsule 0  . Farxiga 10 mg tab tablet Take 10 mg by mouth every morning.    . fenofibrate  nanocrystallized (TRICOR ) 145 mg tablet Take 145 mg by mouth Once Daily.    . fluticasone  propionate (FLONASE ) 50 mcg/spray nasal spray     . gabapentin (NEURONTIN) 100 mg capsule Take 1 capsule by mouth in the morning and 1 capsule at noon and 1 capsule in the evening. Take with meals.    . hydroquinone 4 % cream     . Janumet XR 50-1,000 mg TM24 Take 2 tablets by mouth daily.    . Jardiance 25 mg tab Take 1 tablet by mouth Once Daily.    SABRA ketoconazole (NIZORAL) 2 % cream     . Lantus  Solostar U-100 Insulin  100 unit/mL (3 mL) pen     . Linzess  290 mcg cap capsule TAKE 1 CAPSULE BY MOUTH EVERY DAY FOR CONSTIPATION    . methocarbamoL  (ROBAXIN ) 500 mg tablet Take 500 mg by mouth.    . montelukast (SINGULAIR) 10 mg tablet Take 10 mg by mouth daily.    . Mounjaro 2.5 mg/0.5 mL subcutaneous injection INJECT 2.5MG  ONCE WEEKLY    . neomycin-polymyxin-HC (CORTISPORIN) 3.5-10,000-1 mg/mL-unit/mL-% otic suspension Place 3-4 drops in the left ear 2 times daily for 10 days. 10 mL 1  . OneTouch Delica Plus Lancet 33 gauge misc USE TO CHECK BLOOD GLUCOSE DAILY    . OneTouch Ultra Test test strip USE TO TEST 5 TIMES PER DAY    . pen needle, diabetic (BD Ultra-Fine Mini Pen Needle) 31 gauge x 3/16 ndle     . potassium chloride  (KLOR-CON ) 20 mEq ER tablet     . pravastatin  (PRAVACHOL ) 40 mg tablet Take 40 mg by mouth Once Daily.    . ramelteon (ROZEREM) 8 mg tablet TAKE 1 TABLET(8 MG) BY MOUTH EVERY NIGHT 30 tablet 2  . telmisartan (MICARDIS) 80 mg tab tablet Take 80 mg by mouth daily.    . torsemide  (DEMADEX ) 20 mg tablet TAKE 1 TABLET BY MOUTH  DAILY TO STOP LEG SWELLING.    . traMADoL  (ULTRAM ) 50 mg tablet     . Trelegy Ellipta 100-62.5-25 mcg inhaler     . triamcinolone (KENALOG) 0.1 % ointment Apply small amount to entrance of ear canals daily for 7 days, then as needed 30 g 1  . valACYclovir  (VALTREX ) 500 mg tablet TAKE 1 TABLET(500 MG) BY MOUTH DAILY 30 tablet 11  . valsartan (DIOVAN) 320 mg tablet Take 320 mg by mouth Once Daily.    . zafirlukast (ACCOLATE) 10 mg tablet Take 10 mg by mouth 2 (two) times a day.     No current facility-administered medications for this visit.  [2] Allergies Allergen Reactions  . Dapsone Other (See Comments)    G6PD deficiency  . Primaquine Other (See Comments)    G6PD deficiency  [3] Past Medical History: Diagnosis Date  . Asthma (CMD)   .  Cryptococcosis    (CMD) 2009  . Diabetes    (CMD)   . G6PD deficiency 2009  . Hematuria 2001  . Hepatitis B core antibody positive 05/15/07  . 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 Human immunodeficiency virus (HIV) disease    (CMD) 2008  . Hyperlipidemia   . Hypertension   . Immune to hepatitis A 05/15/07   serology positive  . Immune to hepatitis B 05/15/07   serology positive  . Insomnia   . Pneumonia 2010  . Positive PPD, treated 1980s  . Sinusitis 2011  . Sleep apnea 2014  [4] Past Surgical History: Procedure Laterality Date  . HEMORROIDECTOMY     Procedure: HEMORROIDECTOMY  [5] Family History Problem Relation Name Age of Onset  . Hypertension Mother    . Diabetes Mother    . Hypertension Father    . Stroke Father    . Diabetes Father    . Hypertension Sister    . Diabetes Brother    . Hypertension Maternal Grandmother    . Diabetes Maternal Grandmother    . Hypertension Maternal Grandfather    . Diabetes Maternal Grandfather    . Hypertension Paternal Grandmother    . Diabetes Paternal Grandmother    . Hypertension Paternal Grandfather    . Diabetes Paternal Grandfather    . Cancer Neg Hx    .  Glaucoma Neg Hx    . Macular degeneration Neg Hx    [6] Social History Tobacco Use  . Smoking status: Never  . Smokeless tobacco: Never  Vaping Use  . Vaping status: Never Used  Substance Use Topics  . Alcohol use: Not Currently    Alcohol/week: 20.0 standard drinks of alcohol  . Drug use: No

## 2024-01-28 ENCOUNTER — Emergency Department (HOSPITAL_BASED_OUTPATIENT_CLINIC_OR_DEPARTMENT_OTHER)

## 2024-01-28 ENCOUNTER — Encounter (HOSPITAL_BASED_OUTPATIENT_CLINIC_OR_DEPARTMENT_OTHER): Payer: Self-pay | Admitting: Emergency Medicine

## 2024-01-28 ENCOUNTER — Inpatient Hospital Stay (HOSPITAL_BASED_OUTPATIENT_CLINIC_OR_DEPARTMENT_OTHER)
Admission: EM | Admit: 2024-01-28 | Discharge: 2024-01-31 | DRG: 193 | Disposition: A | Discharge status: Home / Self Care | Attending: Internal Medicine | Admitting: Internal Medicine

## 2024-01-28 DIAGNOSIS — E66812 Obesity, class 2: Secondary | ICD-10-CM | POA: Diagnosis present

## 2024-01-28 DIAGNOSIS — J4520 Mild intermittent asthma, uncomplicated: Secondary | ICD-10-CM | POA: Diagnosis present

## 2024-01-28 DIAGNOSIS — Z7951 Long term (current) use of inhaled steroids: Secondary | ICD-10-CM

## 2024-01-28 DIAGNOSIS — Z79899 Other long term (current) drug therapy: Secondary | ICD-10-CM

## 2024-01-28 DIAGNOSIS — J4489 Other specified chronic obstructive pulmonary disease: Secondary | ICD-10-CM | POA: Diagnosis present

## 2024-01-28 DIAGNOSIS — E1159 Type 2 diabetes mellitus with other circulatory complications: Secondary | ICD-10-CM | POA: Diagnosis present

## 2024-01-28 DIAGNOSIS — I5032 Chronic diastolic (congestive) heart failure: Secondary | ICD-10-CM | POA: Diagnosis present

## 2024-01-28 DIAGNOSIS — I152 Hypertension secondary to endocrine disorders: Secondary | ICD-10-CM | POA: Diagnosis present

## 2024-01-28 DIAGNOSIS — Z21 Asymptomatic human immunodeficiency virus [HIV] infection status: Secondary | ICD-10-CM | POA: Diagnosis present

## 2024-01-28 DIAGNOSIS — I509 Heart failure, unspecified: Secondary | ICD-10-CM | POA: Diagnosis not present

## 2024-01-28 DIAGNOSIS — Z8249 Family history of ischemic heart disease and other diseases of the circulatory system: Secondary | ICD-10-CM | POA: Diagnosis not present

## 2024-01-28 DIAGNOSIS — N184 Chronic kidney disease, stage 4 (severe): Secondary | ICD-10-CM | POA: Diagnosis present

## 2024-01-28 DIAGNOSIS — J440 Chronic obstructive pulmonary disease with acute lower respiratory infection: Secondary | ICD-10-CM | POA: Diagnosis present

## 2024-01-28 DIAGNOSIS — E785 Hyperlipidemia, unspecified: Secondary | ICD-10-CM | POA: Diagnosis present

## 2024-01-28 DIAGNOSIS — Z6835 Body mass index (BMI) 35.0-35.9, adult: Secondary | ICD-10-CM | POA: Diagnosis not present

## 2024-01-28 DIAGNOSIS — G894 Chronic pain syndrome: Secondary | ICD-10-CM | POA: Diagnosis present

## 2024-01-28 DIAGNOSIS — Z794 Long term (current) use of insulin: Secondary | ICD-10-CM

## 2024-01-28 DIAGNOSIS — Z888 Allergy status to other drugs, medicaments and biological substances status: Secondary | ICD-10-CM

## 2024-01-28 DIAGNOSIS — E8770 Fluid overload, unspecified: Secondary | ICD-10-CM

## 2024-01-28 DIAGNOSIS — E113291 Type 2 diabetes mellitus with mild nonproliferative diabetic retinopathy without macular edema, right eye: Secondary | ICD-10-CM | POA: Diagnosis present

## 2024-01-28 DIAGNOSIS — J189 Pneumonia, unspecified organism: Principal | ICD-10-CM | POA: Diagnosis present

## 2024-01-28 DIAGNOSIS — Z1152 Encounter for screening for COVID-19: Secondary | ICD-10-CM | POA: Diagnosis not present

## 2024-01-28 DIAGNOSIS — J9601 Acute respiratory failure with hypoxia: Secondary | ICD-10-CM | POA: Diagnosis present

## 2024-01-28 DIAGNOSIS — J45909 Unspecified asthma, uncomplicated: Secondary | ICD-10-CM | POA: Insufficient documentation

## 2024-01-28 DIAGNOSIS — E1122 Type 2 diabetes mellitus with diabetic chronic kidney disease: Secondary | ICD-10-CM | POA: Diagnosis present

## 2024-01-28 DIAGNOSIS — R0602 Shortness of breath: Secondary | ICD-10-CM

## 2024-01-28 DIAGNOSIS — D631 Anemia in chronic kidney disease: Secondary | ICD-10-CM | POA: Diagnosis present

## 2024-01-28 DIAGNOSIS — Z7985 Long-term (current) use of injectable non-insulin antidiabetic drugs: Secondary | ICD-10-CM

## 2024-01-28 DIAGNOSIS — E1169 Type 2 diabetes mellitus with other specified complication: Secondary | ICD-10-CM | POA: Diagnosis present

## 2024-01-28 DIAGNOSIS — G4733 Obstructive sleep apnea (adult) (pediatric): Secondary | ICD-10-CM | POA: Diagnosis present

## 2024-01-28 DIAGNOSIS — R0902 Hypoxemia: Principal | ICD-10-CM

## 2024-01-28 DIAGNOSIS — Z833 Family history of diabetes mellitus: Secondary | ICD-10-CM | POA: Diagnosis not present

## 2024-01-28 LAB — RESPIRATORY PANEL BY PCR

## 2024-01-28 LAB — I-STAT VENOUS BLOOD GAS, ED
Acid-Base Excess: 2 mmol/L (ref 0.0–2.0)
Bicarbonate: 27.5 mmol/L (ref 20.0–28.0)
Calcium, Ion: 1.14 mmol/L — ABNORMAL LOW (ref 1.15–1.40)
HCT: 41 % (ref 39.0–52.0)
Hemoglobin: 13.9 g/dL (ref 13.0–17.0)
O2 Saturation: 72 %
Patient temperature: 100.2
Potassium: 3.5 mmol/L (ref 3.5–5.1)
Sodium: 139 mmol/L (ref 135–145)
TCO2: 29 mmol/L (ref 22–32)
pCO2, Ven: 46.6 mmHg (ref 44–60)
pH, Ven: 7.382 (ref 7.25–7.43)
pO2, Ven: 41 mmHg (ref 32–45)

## 2024-01-28 LAB — CBC WITH DIFFERENTIAL/PLATELET
Abs Immature Granulocytes: 0.16 K/uL — ABNORMAL HIGH (ref 0.00–0.07)
Basophils Absolute: 0.1 K/uL (ref 0.0–0.1)
Basophils Relative: 0 %
Eosinophils Absolute: 0.3 K/uL (ref 0.0–0.5)
Eosinophils Relative: 2 %
HCT: 38.9 % — ABNORMAL LOW (ref 39.0–52.0)
Hemoglobin: 12.5 g/dL — ABNORMAL LOW (ref 13.0–17.0)
Immature Granulocytes: 1 %
Lymphocytes Relative: 7 %
Lymphs Abs: 1.7 K/uL (ref 0.7–4.0)
MCH: 28.7 pg (ref 26.0–34.0)
MCHC: 32.1 g/dL (ref 30.0–36.0)
MCV: 89.4 fL (ref 80.0–100.0)
Monocytes Absolute: 1.8 K/uL — ABNORMAL HIGH (ref 0.1–1.0)
Monocytes Relative: 8 %
Neutro Abs: 18.8 K/uL — ABNORMAL HIGH (ref 1.7–7.7)
Neutrophils Relative %: 82 %
Platelets: 265 K/uL (ref 150–400)
RBC: 4.35 MIL/uL (ref 4.22–5.81)
RDW: 14.1 % (ref 11.5–15.5)
WBC: 22.7 K/uL — ABNORMAL HIGH (ref 4.0–10.5)
nRBC: 0 % (ref 0.0–0.2)

## 2024-01-28 LAB — BASIC METABOLIC PANEL WITH GFR
Anion gap: 14 (ref 5–15)
BUN: 17 mg/dL (ref 8–23)
CO2: 25 mmol/L (ref 22–32)
Calcium: 9.2 mg/dL (ref 8.9–10.3)
Chloride: 101 mmol/L (ref 98–111)
Creatinine, Ser: 2.32 mg/dL — ABNORMAL HIGH (ref 0.61–1.24)
GFR, Estimated: 31 mL/min — ABNORMAL LOW (ref >60)
Glucose, Bld: 138 mg/dL — ABNORMAL HIGH (ref 70–99)
Potassium: 3.7 mmol/L (ref 3.5–5.1)
Sodium: 139 mmol/L (ref 135–145)

## 2024-01-28 LAB — RESP PANEL BY RT-PCR (RSV, FLU A&B, COVID)  RVPGX2
Influenza A by PCR: NEGATIVE
Influenza B by PCR: NEGATIVE
Resp Syncytial Virus by PCR: NEGATIVE
SARS Coronavirus 2 by RT PCR: NEGATIVE

## 2024-01-28 LAB — GLUCOSE, CAPILLARY: Glucose-Capillary: 329 mg/dL — ABNORMAL HIGH (ref 70–99)

## 2024-01-28 LAB — PRO BRAIN NATRIURETIC PEPTIDE: Pro Brain Natriuretic Peptide: 1438 pg/mL — ABNORMAL HIGH (ref <300.0)

## 2024-01-28 MED ORDER — AMLODIPINE BESYLATE 10 MG PO TABS
10.0000 mg | ORAL_TABLET | Freq: Every day | ORAL | Status: DC
  Administered 2024-01-28 – 2024-01-29 (×2): 10 mg via ORAL
  Filled 2024-01-28: qty 1
  Filled 2024-01-28: qty 2

## 2024-01-28 MED ORDER — PANTOPRAZOLE SODIUM 40 MG PO TBEC
40.0000 mg | DELAYED_RELEASE_TABLET | Freq: Every day | ORAL | Status: DC
  Administered 2024-01-29 – 2024-01-31 (×3): 40 mg via ORAL
  Filled 2024-01-28 (×3): qty 1

## 2024-01-28 MED ORDER — ENOXAPARIN SODIUM 40 MG/0.4ML IJ SOSY: 40.0000 mg | PREFILLED_SYRINGE | INTRAMUSCULAR | Status: DC

## 2024-01-28 MED ORDER — IPRATROPIUM-ALBUTEROL 0.5-2.5 (3) MG/3ML IN SOLN
RESPIRATORY_TRACT | Status: AC
Start: 2024-01-28 — End: 2024-01-28
  Administered 2024-01-28: 3 mL via RESPIRATORY_TRACT
  Filled 2024-01-28: qty 3

## 2024-01-28 MED ORDER — IPRATROPIUM-ALBUTEROL 0.5-2.5 (3) MG/3ML IN SOLN
3.0000 mL | RESPIRATORY_TRACT | Status: DC | PRN
  Administered 2024-01-31: 3 mL via RESPIRATORY_TRACT
  Filled 2024-01-28: qty 3

## 2024-01-28 MED ORDER — ALBUTEROL SULFATE (2.5 MG/3ML) 0.083% IN NEBU: 2.5000 mg | INHALATION_SOLUTION | Freq: Once | RESPIRATORY_TRACT | Status: AC

## 2024-01-28 MED ORDER — ACETAMINOPHEN 325 MG PO TABS
650.0000 mg | ORAL_TABLET | Freq: Four times a day (QID) | ORAL | Status: DC | PRN
Start: 2024-01-28 — End: 2024-01-31

## 2024-01-28 MED ORDER — FUROSEMIDE 10 MG/ML IJ SOLN
40.0000 mg | Freq: Two times a day (BID) | INTRAMUSCULAR | Status: DC
  Administered 2024-01-29: 40 mg via INTRAVENOUS
  Filled 2024-01-28: qty 4

## 2024-01-28 MED ORDER — INSULIN ASPART 100 UNIT/ML IJ SOLN
0.0000 [IU] | Freq: Three times a day (TID) | INTRAMUSCULAR | Status: DC
  Administered 2024-01-29: 5 [IU] via SUBCUTANEOUS
  Administered 2024-01-29: 9 [IU] via SUBCUTANEOUS
  Administered 2024-01-29: 7 [IU] via SUBCUTANEOUS

## 2024-01-28 MED ORDER — METHYLPREDNISOLONE SODIUM SUCC 125 MG IJ SOLR
125.0000 mg | Freq: Once | INTRAMUSCULAR | Status: AC
  Administered 2024-01-28: 125 mg via INTRAVENOUS
  Filled 2024-01-28: qty 2

## 2024-01-28 MED ORDER — VALACYCLOVIR HCL 500 MG PO TABS
500.0000 mg | ORAL_TABLET | Freq: Every day | ORAL | Status: DC
  Administered 2024-01-29 – 2024-01-31 (×3): 500 mg via ORAL
  Filled 2024-01-28 (×3): qty 1

## 2024-01-28 MED ORDER — ALBUTEROL SULFATE (2.5 MG/3ML) 0.083% IN NEBU
INHALATION_SOLUTION | RESPIRATORY_TRACT | Status: AC
  Administered 2024-01-28: 2.5 mg via RESPIRATORY_TRACT
  Filled 2024-01-28: qty 3

## 2024-01-28 MED ORDER — FUROSEMIDE 10 MG/ML IJ SOLN
40.0000 mg | Freq: Once | INTRAMUSCULAR | Status: AC
  Administered 2024-01-28: 40 mg via INTRAVENOUS
  Filled 2024-01-28: qty 4

## 2024-01-28 MED ORDER — ABACAVIR-DOLUTEGRAVIR-LAMIVUD 600-50-300 MG PO TABS
1.0000 | ORAL_TABLET | Freq: Every day | ORAL | Status: DC
  Administered 2024-01-29 – 2024-01-31 (×3): 1 via ORAL
  Filled 2024-01-28 (×3): qty 1

## 2024-01-28 MED ORDER — CARVEDILOL 12.5 MG PO TABS
12.5000 mg | ORAL_TABLET | Freq: Two times a day (BID) | ORAL | Status: DC
  Administered 2024-01-28 – 2024-01-31 (×6): 12.5 mg via ORAL
  Filled 2024-01-28 (×6): qty 1

## 2024-01-28 MED ORDER — INSULIN GLARGINE 100 UNIT/ML ~~LOC~~ SOLN
50.0000 [IU] | Freq: Two times a day (BID) | SUBCUTANEOUS | Status: DC
  Administered 2024-01-29 – 2024-01-31 (×6): 50 [IU] via SUBCUTANEOUS
  Filled 2024-01-28 (×8): qty 0.5

## 2024-01-28 MED ORDER — PRAVASTATIN SODIUM 40 MG PO TABS
80.0000 mg | ORAL_TABLET | Freq: Every day | ORAL | Status: DC
Start: 2024-01-29 — End: 2024-01-31
  Administered 2024-01-29 – 2024-01-31 (×3): 80 mg via ORAL
  Filled 2024-01-28 (×3): qty 2

## 2024-01-28 MED ORDER — IPRATROPIUM-ALBUTEROL 0.5-2.5 (3) MG/3ML IN SOLN: 3.0000 mL | Freq: Once | RESPIRATORY_TRACT | Status: AC

## 2024-01-28 MED ORDER — ACETAMINOPHEN 650 MG RE SUPP: 650.0000 mg | Freq: Four times a day (QID) | RECTAL | Status: DC | PRN

## 2024-01-28 MED ORDER — FENOFIBRATE 160 MG PO TABS: 160.0000 mg | ORAL_TABLET | Freq: Every day | ORAL | Status: DC

## 2024-01-28 MED ORDER — FLUTICASONE FUROATE-VILANTEROL 200-25 MCG/ACT IN AEPB
1.0000 | INHALATION_SPRAY | Freq: Every day | RESPIRATORY_TRACT | Status: DC
  Administered 2024-01-29 – 2024-01-31 (×3): 1 via RESPIRATORY_TRACT
  Filled 2024-01-28: qty 28

## 2024-01-28 MED ORDER — IRBESARTAN 300 MG PO TABS
300.0000 mg | ORAL_TABLET | Freq: Every day | ORAL | Status: DC
  Administered 2024-01-28: 300 mg via ORAL
  Filled 2024-01-28: qty 2

## 2024-01-28 MED ORDER — ZOLPIDEM TARTRATE 5 MG PO TABS
5.0000 mg | ORAL_TABLET | Freq: Once | ORAL | Status: AC
  Administered 2024-01-29: 5 mg via ORAL
  Filled 2024-01-28: qty 1

## 2024-01-28 NOTE — ED Provider Notes (Signed)
  Physical Exam  BP (!) 170/93   Pulse 92   Temp 100.2 F (37.9 C)   Resp (!) 42   Ht 5' 8 (1.727 m)   Wt 107 kg   SpO2 94%   BMI 35.88 kg/m   Physical Exam  Procedures  Procedures  ED Course / MDM    Medical Decision Making Amount and/or Complexity of Data Reviewed Labs: ordered. Radiology: ordered.  Risk Prescription drug management. Decision regarding hospitalization.   Discussed with Dr. Lue, who will admit patient.       Patsey Lot, MD 01/28/24 1537

## 2024-01-28 NOTE — ED Triage Notes (Signed)
 Pt c/o SHOB since yesterday, worse after church today; hx of asthma and bronchitis; in obvious distress upon arrival to triage; RT in to assess; placed on 2L O2 (initial sat 84%)

## 2024-01-28 NOTE — ED Notes (Signed)
 Called [facility] for transport.

## 2024-01-28 NOTE — H&P (Signed)
 History and Physical    GRAYLAND DAISEY FMW:[id number] DOB: [birth year] DOA: 01/28/2024  Patient coming from: Home.  Chief Complaint: Shortness of breath.  HPI: Larry Dickson is a 63 y.o. male with history of HIV, chronic kidney disease stage IV, diabetes mellitus type 2, hypertension, asthma, sleep apnea, CHF presents to the ER because of shortness of breath.  Patient states over the last 2 days he has been getting short of breath with some fluid in the lower extremity.  Patient states he has been compliant with his torsemide .  Denies any chest pain productive cough fever or chills.  Due to persistent symptoms patient presents to the ER at [facility].  ED Course: Chest x-ray shows congestion.  Patient was requiring 3 L oxygen.  VBG shows pH of 7.38 pCO2 of 48.6.  proBNP was 1400.  WBC 22.7 hemoglobin 12.5 creatinine 2.3.  Patient was given Lasix  40 mg IV and also was placed on nebulizer steroids.  Patient does have leukocytosis which appears to be chronic.  Patient admitted for acute respiratory failure with hypoxia likely CHF exacerbation but cannot rule out pneumonia.  COVID flu test and respiratory panel panel were negative.  Review of Systems: As per HPI, rest all negative.   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 FSBS 153; BP 130/90 in office   OSA (obstructive sleep apnea)    Proteinuria 03/25/2013   Formatting of this note might be different  from the original. 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 No pertinent surgical history.   reports that he has never smoked. He does not have any smokeless tobacco history on file. He reports that he does not drink alcohol and does not use drugs.  Allergies  Allergen Reactions   Dapsone     Other Reaction(s): Other (See Comments)  G6PD deficiency   Primaquine     Other Reaction(s): Other (See Comments)  G6PD deficiency    Family History  Problem Relation Age of Onset   Diabetes Mother    Hypertension Father    Diabetes Father    Diabetes Brother    Diabetes Maternal Grandmother    Hypertension Maternal Grandmother    Hypertension Maternal Grandfather     Prior to Admission medications   Medication Sig Start Date End Date Taking? Authorizing Provider  albuterol  (PROVENTIL  HFA;VENTOLIN  HFA) 108 (90 BASE) MCG/ACT inhaler Inhale 1-2 puffs into the lungs every 6 (six) hours as needed for wheezing or shortness of breath.   Yes [provider]  albuterol  (PROVENTIL ) (5 MG/ML) 0.5% nebulizer solution Take 2.5 mg by nebulization every 6 (six) hours as needed for wheezing or shortness of breath.   Yes [provider]  amLODipine  (NORVASC ) 10 MG tablet Take 10 mg by mouth daily.   Yes [provider]  BREZTRI AEROSPHERE 160-9-4.8  MCG/ACT AERO inhaler Inhale 2 puffs into the lungs daily. 12/26/23  Yes [provider]  carvedilol  (COREG ) 12.5 MG tablet Take 1 tablet (12.5 mg total) by mouth 2 (two) times daily with a meal. 11/29/23  Yes Lue Elsie BROCKS, MD  esomeprazole (NEXIUM) 40 MG capsule Take 40 mg by mouth daily. 12/26/23  Yes [provider]  fenofibrate  (TRICOR ) 145 MG tablet Take 145 mg by mouth daily. 04/27/22  Yes [provider]  fluticasone   (FLONASE ) 50 MCG/ACT nasal spray Place 1 spray into both nostrils daily as needed for allergies or rhinitis.   Yes [provider]  fluticasone  furoate-vilanterol (BREO ELLIPTA ) 200-25 MCG/ACT AEPB Inhale 1 puff into the lungs daily. 11/30/23  Yes Lue Elsie BROCKS, MD  insulin  glargine (LANTUS ) 100 UNIT/ML injection Inject 60 Units into the skin 2 (two) times daily.   Yes [provider]  LINZESS  290 MCG CAPS capsule Take 290 mcg by mouth every other day. 05/25/22  Yes [provider]  methocarbamol  (ROBAXIN ) 500 MG tablet Take 1 tablet (500 mg total) by mouth every 8 (eight) hours as needed. Patient taking differently: Take 500 mg by mouth every 8 (eight) hours as needed for muscle spasms. 04/04/22  Yes Long, Joshua G, MD  MOUNJARO 5 MG/0.5ML Pen Inject 5 mg into the skin once a week. 11/01/23  Yes [provider]  pravastatin  (PRAVACHOL ) 80 MG tablet Take 80 mg by mouth daily. 10/26/23  Yes [provider]  SYMBICORT 80-4.5 MCG/ACT inhaler Inhale 2 puffs into the lungs daily. 01/09/24  Yes [provider]  telmisartan (MICARDIS) 80 MG tablet Take 80 mg by mouth daily. 04/27/22  Yes [provider]  torsemide  (DEMADEX ) 20 MG tablet Take 40 mg by mouth daily. 05/10/22  Yes [provider]  traMADol  (ULTRAM ) 50 MG tablet Take 1 tablet (50 mg total) by mouth every 6 (six) hours as needed. Patient taking differently: Take 50 mg by mouth every 6 (six) hours as needed for moderate pain (pain score 4-6). 09/30/14  Yes Patsey Lot, MD  TRIUMEQ  600-50-300 MG tablet Take 1 tablet by mouth daily. 11/24/23  Yes [provider]  valACYclovir  (VALTREX ) 500 MG tablet Take 500 mg by mouth daily. 06/07/22  Yes [provider]  azithromycin  (ZITHROMAX ) 250 MG tablet Take 1 tablet by mouth daily until finished Patient not taking: Reported on 01/28/2024 11/29/23   Lue Elsie BROCKS, MD    Physical Exam: Constitutional:  Moderately built and nourished. Vitals:   01/28/24 1800 01/28/24 1830 01/28/24 1845 01/28/24 1938  BP: (!) 163/87 (!) 171/106 (!) 158/92 (!) 168/98  Pulse: 86 88 91 82  Resp: (!) 33 (!) 35 (!) 27 20  Temp:    98 F (36.7 C)  TempSrc:    Oral  SpO2: 93% 91% 93% 95%  Weight:      Height:       Eyes: Anicteric no pallor. ENMT: No discharge from the ears eyes nose or mouth. Neck: No mass felt.  No neck rigidity. Respiratory: No rhonchi or crepitations. Cardiovascular: S1 S2 heard. Abdomen: Soft nontender bowel sound present. Musculoskeletal: Mild edema of the lower extremities. Skin: No rash. Neurologic: Alert awake oriented to time place and person.  Moves all extremities. Psychiatric: Appears normal.  Normal affect.   Labs on Admission: I have personally reviewed following labs and imaging studies  CBC: Recent Labs  Lab 01/28/24 1334 01/28/24 1345  WBC 22.7*  --   NEUTROABS 18.8*  --  HGB 12.5* 13.9  HCT 38.9* 41.0  MCV 89.4  --   PLT 265  --    Basic Metabolic Panel: Recent Labs  Lab 01/28/24 1334 01/28/24 1345  NA 139 139  K 3.7 3.5  CL 101  --   CO2 25  --   GLUCOSE 138*  --   BUN 17  --   CREATININE 2.32*  --   CALCIUM 9.2  --    GFR: Estimated Creatinine Clearance: 38.1 mL/min (A) (by C-G formula based on SCr of 2.32 mg/dL (H)). Liver Function Tests: No results for input(s): AST, ALT, ALKPHOS, BILITOT, PROT, ALBUMIN in the last 168 hours. No results for input(s): LIPASE, AMYLASE in the last 168 hours. No results for input(s): AMMONIA in the last 168 hours. Coagulation Profile: No results for input(s): INR, PROTIME in the last 168 hours. Cardiac Enzymes: No results for input(s): CKTOTAL, CKMB, CKMBINDEX, TROPONINI in the last 168 hours. BNP (last 3 results) Recent Labs    11/26/23 1359 01/28/24 1334  PROBNP 970.0* 1,438.0*   HbA1C: No results for input(s): HGBA1C in the last 72 hours. CBG: No results for  input(s): GLUCAP in the last 168 hours. Lipid Profile: No results for input(s): CHOL, HDL, LDLCALC, TRIG, CHOLHDL, LDLDIRECT in the last 72 hours. Thyroid Function Tests: No results for input(s): TSH, T4TOTAL, FREET4, T3FREE, THYROIDAB in the last 72 hours. Anemia Panel: No results for input(s): VITAMINB12, FOLATE, FERRITIN, TIBC, IRON, RETICCTPCT in the last 72 hours. Urine analysis: No results found for: COLORURINE, APPEARANCEUR, LABSPEC, PHURINE, GLUCOSEU, HGBUR, BILIRUBINUR, KETONESUR, PROTEINUR, UROBILINOGEN, NITRITE, LEUKOCYTESUR Sepsis Labs: @LABRCNTIP (procalcitonin:4,lacticidven:4) ) Recent Results (from the past 240 hours)  Resp panel by RT-PCR (RSV, Flu A&B, Covid) Anterior Nasal Swab     Status: None   Collection Time: 01/28/24  1:34 PM   Specimen: Anterior Nasal Swab  Result Value Ref Range Status   SARS Coronavirus 2 by RT PCR NEGATIVE NEGATIVE Final    Comment: (NOTE) SARS-CoV-2 target nucleic acids are NOT DETECTED.  The SARS-CoV-2 RNA is generally detectable in upper respiratory specimens during the acute phase of infection. The lowest concentration of SARS-CoV-2 viral copies this assay can detect is 138 copies/mL. A negative result does not preclude SARS-Cov-2 infection and should not be used as the sole basis for treatment or other patient management decisions. A negative result may occur with  improper specimen collection/handling, submission of specimen other than nasopharyngeal swab, presence of viral mutation(s) within the areas targeted by this assay, and inadequate number of viral copies(<138 copies/mL). A negative result must be combined with clinical observations, patient history, and epidemiological information. The expected result is Negative.  Fact Sheet for Patients:  BloggerCourse.com  Fact Sheet for Healthcare Providers:   SeriousBroker.it  This test is no t yet approved or cleared by the [place]  FDA and  has been authorized for detection and/or diagnosis of SARS-CoV-2 by FDA under an Emergency Use Authorization (EUA). This EUA will remain  in effect (meaning this test can be used) for the duration of the COVID-19 declaration under Section 564(b)(1) of the Act, 21 U.S.C.section 360bbb-3(b)(1), unless the authorization is terminated  or revoked sooner.       Influenza A by PCR NEGATIVE NEGATIVE Final   Influenza B by PCR NEGATIVE NEGATIVE Final    Comment: (NOTE) The Xpert Xpress SARS-CoV-2/FLU/RSV plus assay is intended as an aid in the diagnosis of influenza from Nasopharyngeal swab specimens and should not be used as a sole basis for treatment.  Nasal washings and aspirates are unacceptable for Xpert Xpress SARS-CoV-2/FLU/RSV testing.  Fact Sheet for Patients: BloggerCourse.com  Fact Sheet for Healthcare Providers: SeriousBroker.it  This test is not yet approved or cleared by the [place]  FDA and has been authorized for detection and/or diagnosis of SARS-CoV-2 by FDA under an Emergency Use Authorization (EUA). This EUA will remain in effect (meaning this test can be used) for the duration of the COVID-19 declaration under Section 564(b)(1) of the Act, 21 U.S.C. section 360bbb-3(b)(1), unless the authorization is terminated or revoked.     Resp Syncytial Virus by PCR NEGATIVE NEGATIVE Final    Comment: (NOTE) Fact Sheet for Patients: BloggerCourse.com  Fact Sheet for Healthcare Providers: SeriousBroker.it  This test is not yet approved or cleared by the [place]  FDA and has been authorized for detection and/or diagnosis of SARS-CoV-2 by FDA under an Emergency Use Authorization (EUA). This EUA will remain in effect (meaning this test can be used) for  the duration of the COVID-19 declaration under Section 564(b)(1) of the Act, 21 U.S.C. section 360bbb-3(b)(1), unless the authorization is terminated or revoked.  Performed at [facility], [address] [place], [place] [address]   Respiratory (~20 pathogens) panel by PCR     Status: None   Collection Time: 01/28/24  1:34 PM   Specimen: Anterior Nasal Swab; Respiratory  Result Value Ref Range Status   Adenovirus NOT DETECTED NOT DETECTED Final   Coronavirus 229E NOT DETECTED NOT DETECTED Final    Comment: (NOTE) The Coronavirus on the Respiratory Panel, DOES NOT test for the novel  Coronavirus (2019 nCoV)    Coronavirus HKU1 NOT DETECTED NOT DETECTED Final   Coronavirus NL63 NOT DETECTED NOT DETECTED Final   Coronavirus OC43 NOT DETECTED NOT DETECTED Final   Metapneumovirus NOT DETECTED NOT DETECTED Final   Rhinovirus / Enterovirus NOT DETECTED NOT DETECTED Final   Influenza A NOT DETECTED NOT DETECTED Final   Influenza B NOT DETECTED NOT DETECTED Final   Parainfluenza Virus 1 NOT DETECTED NOT DETECTED Final   Parainfluenza Virus 2 NOT DETECTED NOT DETECTED Final   Parainfluenza Virus 3 NOT DETECTED NOT DETECTED Final   Parainfluenza Virus 4 NOT DETECTED NOT DETECTED Final   Respiratory Syncytial Virus NOT DETECTED NOT DETECTED Final   Bordetella pertussis NOT DETECTED NOT DETECTED Final   Bordetella Parapertussis NOT DETECTED NOT DETECTED Final   Chlamydophila pneumoniae NOT DETECTED NOT DETECTED Final   Mycoplasma pneumoniae NOT DETECTED NOT DETECTED Final    Comment: Performed at [facility], [address], [place], [place] [address]     Radiological Exams on Admission: DG Chest Port 1 View Result Date: 01/28/2024 CLINICAL DATA:  Shortness of breath. EXAM: PORTABLE CHEST 1 VIEW COMPARISON:  Chest radiograph dated 11/26/2023. FINDINGS: There is mild cardiomegaly and mild vascular congestion. No focal consolidation, pleural effusion or pneumothorax.  Atherosclerotic calcification of the aorta. No acute osseous pathology. IMPRESSION: Mild cardiomegaly and mild vascular congestion. Electronically Signed   By: Vanetta Chou M.D.   On: 01/28/2024 14:46    EKG: Independently reviewed.  Normal sinus rhythm APCs.  Assessment/Plan Principal Problem:   Acute respiratory failure with hypoxia (HCC) Active Problems:   HIV (human immunodeficiency virus infection) (HCC)   Hypertension associated with diabetes (HCC)   OSA (obstructive sleep apnea)   Chronic pain syndrome   Hyperlipidemia associated with type 2 diabetes mellitus (HCC)   CKD (chronic kidney disease), stage IV (HCC)   Acute CHF (congestive  heart failure) (HCC)   Asthma    Acute respiratory failure with hypoxia presently on 3 L oxygen likely a combination of CHF uncontrolled hypertension and possible pneumonia. Acute CHF unknown EF.  Will check 2D echo.  Patient is presently on Lasix  40 mg IV every 12 hourly for 3 more doses.  Closely follow intake output metabolic panel daily weights.  In addition will check D-dimer. Possible pneumonia will keep patient on empiric antibiotics check procalcitonin and LDH.  Last CD4 count in May 2025 was 747. Hypertension uncontrolled likely contributing to patient's symptoms.  Patient is on ARB Coreg  amlodipine  and presently on IV Lasix .  Follow blood pressure trends. Asthma presently not wheezing continue Breo Ellipta . HIV last CD4 count was 747 in May 2025.  Continue Triumeq . Diabetes mellitus type II takes Lantus  insulin  52 units twice daily along with Mounjaro.  Presently on sliding scale coverage and 50 units twice daily.  Last hemoglobin A1c was 5.2  2 months ago. Chronic kidney disease stage IV creatinine at around baseline.  Closely monitor. Chronic anemia likely from renal disease follow CBC. Chronic leukocytosis will need follow-up as outpatient. Sleep apnea on CPAP at bedtime. Hyperlipidemia on statins and fenofibrate .  Since patient  has acute respiratory failure will need close monitoring further workup and more than 2 midnight stay.   DVT prophylaxis: Heparin . Code Status: Full code. Family Communication: Discussed with patient. Disposition Plan: Monitored bed. Consults called: None. Admission status: Inpatient.

## 2024-01-28 NOTE — ED Provider Notes (Addendum)
 [facility] Provider Note   CSN: [id number] Arrival date & time: 01/28/24  1303     Patient presents with: Shortness of Breath   Larry Dickson is a 63 y.o. male.    Shortness of Breath     Patient presents because of increasing shortness of breath.  Patient states that over the past couple of days, he has been having increasing shortness of breath.  Patient states that he has been having a nonproductive cough.  No fevers or chills that he is aware of.  Patient does not wear oxygen at home.  Patient states that has been tried to use NSAIDs inhaler but feels like is not really working that well.  No chest pain.  No pleuritic chest pain.  No hemoptysis.  No previous history of DVT or PE.  No unilateral leg swelling.  Patient states has been taken torsemide  and is been compliant with all of his other medication.   Previous medical history reviewed : Patient with left middle June 2024.  Acute respiratory failure with hypoxia.   acute asthma exacerbation triggered by viral respiratory infection.  Has underlying COPD complicating his respiratory status.  Had a CTA chest at that time which was unremarkable. Currently on Triumeq  for HIV.    Prior to Admission medications   Medication Sig Start Date End Date Taking? Authorizing Provider  albuterol  (PROVENTIL  HFA;VENTOLIN  HFA) 108 (90 BASE) MCG/ACT inhaler Inhale 1-2 puffs into the lungs every 6 (six) hours as needed for wheezing or shortness of breath.    [provider]  albuterol  (PROVENTIL ) (5 MG/ML) 0.5% nebulizer solution Take 2.5 mg by nebulization every 6 (six) hours as needed for wheezing or shortness of breath.    [provider]  amLODipine  (NORVASC ) 10 MG tablet Take 10 mg by mouth daily.    [provider]  azithromycin  (ZITHROMAX ) 250 MG tablet Take 1 tablet by mouth daily until finished 11/29/23   Lue Elsie BROCKS, MD  carvedilol  (COREG ) 12.5 MG tablet  Take 1 tablet (12.5 mg total) by mouth 2 (two) times daily with a meal. 11/29/23   Lue Elsie BROCKS, MD  fenofibrate  (TRICOR ) 145 MG tablet Take 145 mg by mouth daily. 04/27/22   [provider]  fluticasone  (FLONASE ) 50 MCG/ACT nasal spray Place 1 spray into both nostrils daily as needed for allergies or rhinitis.    [provider]  fluticasone  furoate-vilanterol (BREO ELLIPTA ) 200-25 MCG/ACT AEPB Inhale 1 puff into the lungs daily. 11/30/23   Lue Elsie BROCKS, MD  insulin  glargine (LANTUS ) 100 UNIT/ML injection Inject 60 Units into the skin 2 (two) times daily.    [provider]  LINZESS  290 MCG CAPS capsule Take 290 mcg by mouth every other day. 05/25/22   [provider]  methocarbamol  (ROBAXIN ) 500 MG tablet Take 1 tablet (500 mg total) by mouth every 8 (eight) hours as needed. 04/04/22   Long, Joshua G, MD  MOUNJARO 5 MG/0.5ML Pen Inject 5 mg into the skin once a week. 11/01/23   [provider]  pravastatin  (PRAVACHOL ) 80 MG tablet Take 80 mg by mouth daily. 10/26/23   [provider]  telmisartan (MICARDIS) 80 MG tablet Take 80 mg by mouth daily. 04/27/22   [provider]  torsemide  (DEMADEX ) 20 MG tablet Take 40 mg by mouth daily. 05/10/22   [provider]  traMADol  (ULTRAM ) 50 MG tablet Take 1 tablet (50 mg total) by mouth every 6 (six) hours  as needed. 09/30/14   Patsey Lot, MD  TRIUMEQ  600-50-300 MG tablet Take 1 tablet by mouth daily. 11/24/23   [provider]  valACYclovir  (VALTREX ) 500 MG tablet Take 500 mg by mouth daily. 06/07/22   [provider]    Allergies: Dapsone and Primaquine    Review of Systems  Respiratory:  Positive for shortness of breath.     Updated Vital Signs BP (!) 170/93   Pulse 92   Temp 100.2 F (37.9 C)   Resp (!) 42   Ht 5' 8 (1.727 m)   Wt 107 kg   SpO2 94%   BMI 35.88 kg/m   Physical Exam Vitals and nursing note reviewed.  Constitutional:       General: He is not in acute distress.    Appearance: He is well-developed.  HENT:     Head: Normocephalic and atraumatic.  Eyes:     Conjunctiva/sclera: Conjunctivae normal.  Cardiovascular:     Rate and Rhythm: Normal rate and regular rhythm.     Heart sounds: No murmur heard. Pulmonary:     Effort: Pulmonary effort is normal. No respiratory distress.     Breath sounds: Decreased breath sounds and wheezing present.  Abdominal:     Palpations: Abdomen is soft.     Tenderness: There is no abdominal tenderness.  Musculoskeletal:        General: No swelling.     Cervical back: Neck supple.  Skin:    General: Skin is warm and dry.     Capillary Refill: Capillary refill takes less than 2 seconds.  Neurological:     Mental Status: He is alert.  Psychiatric:        Mood and Affect: Mood normal.     (all labs ordered are listed, but only abnormal results are displayed) Labs Reviewed  BASIC METABOLIC PANEL WITH GFR - Abnormal; Notable for the following components:      Result Value   Glucose, Bld 138 (*)    Creatinine, Ser 2.32 (*)    GFR, Estimated 31 (*)    All other components within normal limits  CBC WITH DIFFERENTIAL/PLATELET - Abnormal; Notable for the following components:   WBC 22.7 (*)    Hemoglobin 12.5 (*)    HCT 38.9 (*)    Neutro Abs 18.8 (*)    Monocytes Absolute 1.8 (*)    Abs Immature Granulocytes 0.16 (*)    All other components within normal limits  PRO BRAIN NATRIURETIC PEPTIDE - Abnormal; Notable for the following components:   Pro Brain Natriuretic Peptide 1,438.0 (*)    All other components within normal limits  I-STAT VENOUS BLOOD GAS, ED - Abnormal; Notable for the following components:   Calcium, Ion 1.14 (*)    All other components within normal limits  RESP PANEL BY RT-PCR (RSV, FLU A&B, COVID)  [id number]  RESPIRATORY PANEL BY PCR    EKG: EKG Interpretation Date/Time:  Sunday January 28 2024 13:24:41 EDT Ventricular Rate:  99 PR  Interval:  215 QRS Duration:  81 QT Interval:  334 QTC Calculation: 422 R Axis:   72  Text Interpretation: Sinus rhythm Atrial premature complexes Prolonged PR interval Nonspecific T abnormalities, lateral leads Confirmed by Simon Rea [phone]) on 01/28/2024 1:59:28 PM  Radiology: [facility] Chest Port 1 View Result Date: 01/28/2024 CLINICAL DATA:  Shortness of breath. EXAM: PORTABLE CHEST 1 VIEW COMPARISON:  Chest radiograph dated 11/26/2023. FINDINGS: There is mild cardiomegaly and mild vascular congestion. No focal consolidation, pleural  effusion or pneumothorax. Atherosclerotic calcification of the aorta. No acute osseous pathology. IMPRESSION: Mild cardiomegaly and mild vascular congestion. Electronically Signed   By: Vanetta Chou M.D.   On: 01/28/2024 14:46     Procedures   Medications Ordered in the ED  ipratropium-albuterol  (DUONEB) 0.5-2.5 (3) MG/3ML nebulizer solution 3 mL (has no administration in time range)  ipratropium-albuterol  (DUONEB) 0.5-2.5 (3) MG/3ML nebulizer solution 3 mL (3 mLs Nebulization Given 01/28/24 1329)  albuterol  (PROVENTIL ) (2.5 MG/3ML) 0.083% nebulizer solution 2.5 mg (2.5 mg Nebulization Given 01/28/24 1328)  methylPREDNISolone  sodium succinate (SOLU-MEDROL ) 125 mg/2 mL injection 125 mg (125 mg Intravenous Given 01/28/24 1342)  furosemide  (LASIX ) injection 40 mg (40 mg Intravenous Given 01/28/24 1457)                                    Medical Decision Making Amount and/or Complexity of Data Reviewed Labs: ordered. Radiology: ordered.  Risk Prescription drug management. Decision regarding hospitalization.   Patient presents because of increasing shortness of breath.  Patient states that over the past couple of days, he has been having increasing shortness of breath.  Patient states that he has been having a nonproductive cough.  No fevers or chills that he is aware of.  Patient does not wear oxygen at home.  Patient states that has been tried to use  NSAIDs inhaler but feels like is not really working that well.  No chest pain.  No pleuritic chest pain.  No hemoptysis.  No previous history of DVT or PE.  No unilateral leg swelling.  Patient states has been taken torsemide  and is been compliant with all of his other medication.   Previous medical history reviewed : Patient with left middle June 2024.  Acute respiratory failure with hypoxia.  Fairly acute asthma exacerbation triggered by viral respiratory infection.  Has underlying COPD complicating his respiratory status.  Had a CTA chest at that time which was unremarkable. Currently on Triumeq  for HIV.    Upon exam, patient in slight respiratory distress.  Improving after starting patient on nasal cannula.  When he first arrived from triage, patient was 84% O2 saturation on room air.  Patient subsequently uptitrated to 4 to 5 L nasal cannula.  Auscultation shows decreased breath sounds bilaterally with wheezing.   After DuoNeb, patient's increased work of breathing seem to improve a little bit.  Still has some slight tachypnea but no significant wheezing.  In terms of etiology of shortness of breath, feeling this is accommodation of probably asthma exacerbation, concern for possible hypervolemia as well as could be some form of viral component.  Pending respiratory viral panel.  Negative COVID RSV and flu.  No obvious pneumonia on the chest x-ray.  Patient does have a leukocytosis but this is around his baseline.  No changes compared to prior.  Will not start on antibiotics at this time.   Patient's creatinine of 2.32 is around patient's baseline.  Patient has elevated BNP.  Also has some vascular congestion on x-ray.  No previous echo that I can appreciate.  Patient is on diuretics p.o.  Started patient on IV diuretics here as well to see if this would help improve patient's respiratory status.  Probably would benefit from echo to assess for any CHF given patient's presentation.   No  indication for positive pressure at this point time.  Patient is doing well on 2 to 3 L. No hypercapnia/acidosis.  No chest pain.  No concerns for ACS process.  No chest pain or pleuritic chest pain.  No hemoptysis.  No history of DVT or PE.  No unilateral leg swelling.  I think my concern for any kind of PE right now is very low.  During last admission, patient had very similar presentation where he had a CT of the chest which was unremarkable.    CRITICAL CARE Performed by: Lavonia LOISE Pat   Total critical care time: 40 minutes  Critical care time was exclusive of separately billable procedures and treating other patients.  Critical care was necessary to treat or prevent imminent or life-threatening deterioration.  Critical care was time spent personally by me on the following activities: development of treatment plan with patient and/or surrogate as well as nursing, discussions with consultants, evaluation of patient's response to treatment, examination of patient, obtaining history from patient or surrogate, ordering and performing treatments and interventions, ordering and review of laboratory studies, ordering and review of radiographic studies, pulse oximetry and re-evaluation of patient's condition.   Final diagnoses:  Hypoxia  Hypervolemia, unspecified hypervolemia type  Shortness of breath    ED Discharge Orders     None          Pat Lavonia LOISE, MD 01/28/24 1523    Pat Lavonia LOISE, MD 02/12/24 1045

## 2024-01-29 ENCOUNTER — Inpatient Hospital Stay (HOSPITAL_COMMUNITY)

## 2024-01-29 DIAGNOSIS — I509 Heart failure, unspecified: Secondary | ICD-10-CM

## 2024-01-29 DIAGNOSIS — J9601 Acute respiratory failure with hypoxia: Secondary | ICD-10-CM

## 2024-01-29 LAB — TSH: TSH: 1.356 u[IU]/mL (ref 0.350–4.500)

## 2024-01-29 LAB — GLUCOSE, CAPILLARY
Glucose-Capillary: 374 mg/dL — ABNORMAL HIGH (ref 70–99)
Glucose-Capillary: 281 mg/dL — ABNORMAL HIGH (ref 70–99)
Glucose-Capillary: 343 mg/dL — ABNORMAL HIGH (ref 70–99)
Glucose-Capillary: 339 mg/dL — ABNORMAL HIGH (ref 70–99)

## 2024-01-29 LAB — BASIC METABOLIC PANEL WITH GFR
Anion gap: 12 (ref 5–15)
BUN: 31 mg/dL — ABNORMAL HIGH (ref 8–23)
CO2: 22 mmol/L (ref 22–32)
Calcium: 8.9 mg/dL (ref 8.9–10.3)
Chloride: 101 mmol/L (ref 98–111)
Creatinine, Ser: 2.79 mg/dL — ABNORMAL HIGH (ref 0.61–1.24)
GFR, Estimated: 25 mL/min — ABNORMAL LOW (ref >60)
Glucose, Bld: 237 mg/dL — ABNORMAL HIGH (ref 70–99)
Potassium: 3.7 mmol/L (ref 3.5–5.1)
Sodium: 135 mmol/L (ref 135–145)

## 2024-01-29 LAB — CBC WITH DIFFERENTIAL/PLATELET
Abs Immature Granulocytes: 0.15 K/uL — ABNORMAL HIGH (ref 0.00–0.07)
Basophils Absolute: 0 K/uL (ref 0.0–0.1)
Basophils Relative: 0 %
Eosinophils Absolute: 0 K/uL (ref 0.0–0.5)
Eosinophils Relative: 0 %
HCT: 39.6 % (ref 39.0–52.0)
Hemoglobin: 12.7 g/dL — ABNORMAL LOW (ref 13.0–17.0)
Immature Granulocytes: 1 %
Lymphocytes Relative: 5 %
Lymphs Abs: 1.1 K/uL (ref 0.7–4.0)
MCH: 28.7 pg (ref 26.0–34.0)
MCHC: 32.1 g/dL (ref 30.0–36.0)
MCV: 89.6 fL (ref 80.0–100.0)
Monocytes Absolute: 0.9 K/uL (ref 0.1–1.0)
Monocytes Relative: 4 %
Neutro Abs: 19.3 K/uL — ABNORMAL HIGH (ref 1.7–7.7)
Neutrophils Relative %: 90 %
Platelets: 233 K/uL (ref 150–400)
RBC: 4.42 MIL/uL (ref 4.22–5.81)
RDW: 13.8 % (ref 11.5–15.5)
WBC: 21.6 K/uL — ABNORMAL HIGH (ref 4.0–10.5)
nRBC: 0 % (ref 0.0–0.2)

## 2024-01-29 LAB — CBC
HCT: 37.7 % — ABNORMAL LOW (ref 39.0–52.0)
Hemoglobin: 12 g/dL — ABNORMAL LOW (ref 13.0–17.0)
MCH: 28.6 pg (ref 26.0–34.0)
MCHC: 31.8 g/dL (ref 30.0–36.0)
MCV: 90 fL (ref 80.0–100.0)
Platelets: 244 K/uL (ref 150–400)
RBC: 4.19 MIL/uL — ABNORMAL LOW (ref 4.22–5.81)
RDW: 13.8 % (ref 11.5–15.5)
WBC: 17.1 K/uL — ABNORMAL HIGH (ref 4.0–10.5)
nRBC: 0 % (ref 0.0–0.2)

## 2024-01-29 LAB — ECHOCARDIOGRAM COMPLETE
AR max vel: 2.17 cm2
AV Peak grad: 13.7 mmHg
Ao pk vel: 1.85 m/s
Area-P 1/2: 3.72 cm2
Height: 68 in
S' Lateral: 2.8 cm
Weight: 3774.28 [oz_av]

## 2024-01-29 LAB — D-DIMER, QUANTITATIVE: D-Dimer, Quant: 1.14 ug{FEU}/mL — ABNORMAL HIGH (ref 0.00–0.50)

## 2024-01-29 LAB — LACTATE DEHYDROGENASE: LDH: 196 U/L — ABNORMAL HIGH (ref 98–192)

## 2024-01-29 LAB — TROPONIN I (HIGH SENSITIVITY): Troponin I (High Sensitivity): 25 ng/L — ABNORMAL HIGH (ref <18)

## 2024-01-29 LAB — CREATININE, SERUM
Creatinine, Ser: 2.86 mg/dL — ABNORMAL HIGH (ref 0.61–1.24)
GFR, Estimated: 24 mL/min — ABNORMAL LOW (ref >60)

## 2024-01-29 LAB — PROCALCITONIN: Procalcitonin: 0.71 ng/mL

## 2024-01-29 MED ORDER — AZITHROMYCIN 250 MG PO TABS
500.0000 mg | ORAL_TABLET | Freq: Every day | ORAL | Status: DC
  Administered 2024-01-30 – 2024-01-31 (×2): 500 mg via ORAL
  Filled 2024-01-29 (×2): qty 2

## 2024-01-29 MED ORDER — SODIUM CHLORIDE 0.9 % IV SOLN
2.0000 g | INTRAVENOUS | Status: DC
  Administered 2024-01-29 – 2024-01-31 (×3): 2 g via INTRAVENOUS
  Filled 2024-01-29 (×3): qty 20

## 2024-01-29 MED ORDER — LINACLOTIDE 145 MCG PO CAPS
290.0000 ug | ORAL_CAPSULE | ORAL | Status: DC
  Administered 2024-01-29: 290 ug via ORAL
  Filled 2024-01-29: qty 2

## 2024-01-29 MED ORDER — METHOCARBAMOL 500 MG PO TABS: 500.0000 mg | ORAL_TABLET | Freq: Three times a day (TID) | ORAL | Status: DC | PRN

## 2024-01-29 MED ORDER — ISOSORB DINITRATE-HYDRALAZINE 20-37.5 MG PO TABS
1.0000 | ORAL_TABLET | Freq: Three times a day (TID) | ORAL | Status: DC
  Administered 2024-01-29 – 2024-01-31 (×7): 1 via ORAL
  Filled 2024-01-29 (×9): qty 1

## 2024-01-29 MED ORDER — ENOXAPARIN SODIUM 60 MG/0.6ML IJ SOSY
55.0000 mg | PREFILLED_SYRINGE | INTRAMUSCULAR | Status: DC
  Administered 2024-01-29 – 2024-01-31 (×3): 55 mg via SUBCUTANEOUS
  Filled 2024-01-29 (×3): qty 0.6

## 2024-01-29 MED ORDER — ZOLPIDEM TARTRATE 5 MG PO TABS
5.0000 mg | ORAL_TABLET | Freq: Once | ORAL | Status: AC
  Administered 2024-01-29: 5 mg via ORAL
  Filled 2024-01-29: qty 1

## 2024-01-29 MED ORDER — SODIUM CHLORIDE 0.9 % IV SOLN
500.0000 mg | INTRAVENOUS | Status: DC
  Administered 2024-01-29: 500 mg via INTRAVENOUS
  Filled 2024-01-29: qty 5

## 2024-01-29 NOTE — Progress Notes (Signed)
 [facility] Progress Note Patient: Larry Dickson FMW:[id number] DOB: [birth year]  DOA: 01/28/2024 DOS: the patient was seen and examined on 01/29/2024  Brief Hospital Course:  YEHONATAN GRANDISON is a 63 y.o. male with history of HIV, chronic kidney disease stage IV, diabetes mellitus type 2, hypertension, asthma, sleep apnea, CHF presents to the ER because of shortness of breath.  Patient states over the last 2 days he has been getting short of breath with some fluid in the lower extremity.  Patient states he has been compliant with his torsemide .  Denies any chest pain productive cough fever or chills.  Due to persistent symptoms patient presents to the ER at [facility].  Currently being treated for pneumonia. Assessment and Plan: Acute respiratory failure with hypoxia  Required 3 LPM in the ED. Currently oxygenation improving.  94% on room air both exertion as well as at rest. Appears euvolemic. Suspect secondary to pneumonia rather than heart failure. Continue with IV antibiotic.  Concern for acute diastolic CHF  Echocardiogram shows EF 65% grade 2 diastolic dysfunction. Renal function worsening after diuresis. Currently holding diuresis. Will monitor for now. Volume status appears to be adequate for now.  Hypertension uncontrolled  likely contributing to patient's symptoms.  Patient is on ARB Coreg  amlodipine  and presently on IV Lasix .  Follow blood pressure trends.  Asthma  presently not wheezing continue Breo Ellipta .  HIV  last CD4 count was 747 in May 2025.  Continue Triumeq . Patient reports compliance.  Diabetes mellitus type II  takes Lantus  insulin  52 units twice daily along with Mounjaro.  Presently on sliding scale coverage and 50 units twice daily.  Last hemoglobin A1c was 5.2  2 months ago.  Chronic kidney disease stage IV  Baseline creatinine around 2. On admission serum creatinine was 2.3. Worsened to 2.79 today after diuresis. Currently holding diuresis  monitor  Chronic anemia  likely from renal disease follow CBC.  Sleep apnea  on CPAP at bedtime.  Hyperlipidemia  on statins and fenofibrate .     Subjective: Denies any acute complaint.  No nausea no vomiting.  Continues to have cough and shortness of breath.  Physical Exam: Bilateral basal crackles. S1-S2 present. Bowel sound present Trace edema.  Data Reviewed: I have Reviewed nursing notes, Vitals, and Lab results. Since last encounter, pertinent lab results CBC and BMP   . I have ordered test including CBC and BMP chest x-ray  .   Disposition: Status is: Inpatient Remains inpatient appropriate because: Monitor for improvement in volume status and renal function  Family Communication: No one at bedside Level of care: Telemetry Medical   Vitals:   01/29/24 0500 01/29/24 0752 01/29/24 0851 01/29/24 1717  BP:   (!) 171/93 139/75  Pulse:   84 80  Resp:    17  Temp:   97.9 F (36.6 C) 97.6 F (36.4 C)  TempSrc:   Oral Oral  SpO2:  95% 92% 96%  Weight: 107 kg     Height:         Author: Yetta Blanch, MD 01/29/2024 7:14 PM  Please look on [email or web address] to find out who is on call.

## 2024-01-29 NOTE — Plan of Care (Signed)

## 2024-01-29 NOTE — Hospital Course (Signed)
 Larry Dickson is a 63 y.o. male with history of HIV, chronic kidney disease stage IV, diabetes mellitus type 2, hypertension, asthma, sleep apnea, CHF presents to the ER because of shortness of breath.  Patient states over the last 2 days he has been getting short of breath with some fluid in the lower extremity.  Patient states he has been compliant with his torsemide .  Denies any chest pain productive cough fever or chills.  Due to persistent symptoms patient presents to the ER at [facility].  Currently being treated for pneumonia. Assessment and Plan: Acute respiratory failure with hypoxia  Required 3 LPM in the ED. Currently oxygenation improving.  94% on room air both exertion as well as at rest. Appears euvolemic. Suspect secondary to pneumonia rather than heart failure. Continue with IV antibiotic.  Concern for acute diastolic CHF  Echocardiogram shows EF 65% grade 2 diastolic dysfunction. Renal function worsening after diuresis. Currently holding diuresis. Will monitor for now. Volume status appears to be adequate for now.  Hypertension uncontrolled  likely contributing to patient's symptoms.  Patient is on ARB Coreg  amlodipine  and presently on IV Lasix .  Follow blood pressure trends.  Asthma  presently not wheezing continue Breo Ellipta .  HIV  last CD4 count was 747 in May 2025.  Continue Triumeq . Patient reports compliance.  Diabetes mellitus type II  takes Lantus  insulin  52 units twice daily along with Mounjaro.  Presently on sliding scale coverage and 50 units twice daily.  Last hemoglobin A1c was 5.2  2 months ago.  Chronic kidney disease stage IV  Baseline creatinine around 2. On admission serum creatinine was 2.3. Worsened to 2.79 today after diuresis. Currently holding diuresis monitor  Chronic anemia  likely from renal disease follow CBC.  Sleep apnea  on CPAP at bedtime.  Hyperlipidemia  on statins and fenofibrate .

## 2024-01-29 NOTE — Progress Notes (Signed)
 Heart Failure Navigator Progress Note  Assessed for Heart & Vascular TOC clinic readiness.  Patient does not meet criteria due to he is a [facility] patient. No HF TOC. .   Navigator will sign off at this time.   Stephane Haddock, BSN, Scientist, clinical (histocompatibility and immunogenetics) Only

## 2024-01-29 NOTE — Progress Notes (Signed)
 Echocardiogram 2D Echocardiogram has been performed.  Larry Dickson 01/29/2024, 4:28 PM

## 2024-01-29 NOTE — Progress Notes (Signed)
 Mobility Specialist Progress Note:   01/29/24 1100  Mobility  Activity Ambulated with assistance  Level of Assistance Standby assist, set-up cues, supervision of patient - no hands on  Assistive Device None  Distance Ambulated (ft) 400 ft  Activity Response Tolerated well  Mobility Referral Yes  Mobility visit 1 Mobility  Mobility Specialist Start Time (ACUTE ONLY) 1100  Mobility Specialist Stop Time (ACUTE ONLY) 1115  Mobility Specialist Time Calculation (min) (ACUTE ONLY) 15 min   Pt agreeable to mobility session. Required no physical assistance throughout ambulation with no AD use. VSS on RA throughout. Back sitting EOB with all needs met.   Therisa Rana Mobility Specialist Please contact via SecureChat or  Rehab office at [phone]

## 2024-01-29 NOTE — Plan of Care (Signed)
   Problem: Education: Goal: Knowledge of General Education information will improve Description: Including pain rating scale, medication(s)/side effects and non-pharmacologic comfort measures Outcome: Progressing   Problem: Nutrition: Goal: Adequate nutrition will be maintained Outcome: Progressing   Problem: Elimination: Goal: Will not experience complications related to bowel motility Outcome: Progressing

## 2024-01-30 ENCOUNTER — Other Ambulatory Visit: Payer: Self-pay

## 2024-01-30 DIAGNOSIS — J9601 Acute respiratory failure with hypoxia: Secondary | ICD-10-CM | POA: Diagnosis not present

## 2024-01-30 LAB — RENAL FUNCTION PANEL
Albumin: 2.4 g/dL — ABNORMAL LOW (ref 3.5–5.0)
Anion gap: 12 (ref 5–15)
BUN: 45 mg/dL — ABNORMAL HIGH (ref 8–23)
CO2: 23 mmol/L (ref 22–32)
Calcium: 8.4 mg/dL — ABNORMAL LOW (ref 8.9–10.3)
Chloride: 99 mmol/L (ref 98–111)
Creatinine, Ser: 2.86 mg/dL — ABNORMAL HIGH (ref 0.61–1.24)
GFR, Estimated: 24 mL/min — ABNORMAL LOW (ref >60)
Glucose, Bld: 272 mg/dL — ABNORMAL HIGH (ref 70–99)
Phosphorus: 3.7 mg/dL (ref 2.5–4.6)
Potassium: 3.6 mmol/L (ref 3.5–5.1)
Sodium: 134 mmol/L — ABNORMAL LOW (ref 135–145)

## 2024-01-30 LAB — CBC
HCT: 33.6 % — ABNORMAL LOW (ref 39.0–52.0)
Hemoglobin: 10.8 g/dL — ABNORMAL LOW (ref 13.0–17.0)
MCH: 29 pg (ref 26.0–34.0)
MCHC: 32.1 g/dL (ref 30.0–36.0)
MCV: 90.1 fL (ref 80.0–100.0)
Platelets: 245 K/uL (ref 150–400)
RBC: 3.73 MIL/uL — ABNORMAL LOW (ref 4.22–5.81)
RDW: 13.7 % (ref 11.5–15.5)
WBC: 23.4 K/uL — ABNORMAL HIGH (ref 4.0–10.5)
nRBC: 0 % (ref 0.0–0.2)

## 2024-01-30 LAB — MAGNESIUM: Magnesium: 2.3 mg/dL (ref 1.7–2.4)

## 2024-01-30 LAB — PROCALCITONIN: Procalcitonin: 0.79 ng/mL

## 2024-01-30 LAB — GLUCOSE, CAPILLARY
Glucose-Capillary: 408 mg/dL — ABNORMAL HIGH (ref 70–99)
Glucose-Capillary: 276 mg/dL — ABNORMAL HIGH (ref 70–99)
Glucose-Capillary: 173 mg/dL — ABNORMAL HIGH (ref 70–99)
Glucose-Capillary: 168 mg/dL — ABNORMAL HIGH (ref 70–99)

## 2024-01-30 MED ORDER — INSULIN ASPART 100 UNIT/ML IJ SOLN
0.0000 [IU] | Freq: Three times a day (TID) | INTRAMUSCULAR | Status: DC
  Administered 2024-01-30: 4 [IU] via SUBCUTANEOUS
  Administered 2024-01-30: 11 [IU] via SUBCUTANEOUS
  Administered 2024-01-31: 4 [IU] via SUBCUTANEOUS
  Administered 2024-01-31: 7 [IU] via SUBCUTANEOUS

## 2024-01-30 MED ORDER — INSULIN ASPART 100 UNIT/ML IJ SOLN: 0.0000 [IU] | Freq: Every day | INTRAMUSCULAR | Status: DC

## 2024-01-30 MED ORDER — ZOLPIDEM TARTRATE 5 MG PO TABS
5.0000 mg | ORAL_TABLET | Freq: Once | ORAL | Status: AC
  Administered 2024-01-30: 5 mg via ORAL
  Filled 2024-01-30: qty 1

## 2024-01-30 MED ORDER — LINACLOTIDE 145 MCG PO CAPS
290.0000 ug | ORAL_CAPSULE | Freq: Every day | ORAL | Status: DC
  Administered 2024-01-30 – 2024-01-31 (×2): 290 ug via ORAL
  Filled 2024-01-30 (×2): qty 2

## 2024-01-30 MED ORDER — INSULIN ASPART 100 UNIT/ML IJ SOLN
3.0000 [IU] | Freq: Three times a day (TID) | INTRAMUSCULAR | Status: DC
  Administered 2024-01-30 – 2024-01-31 (×4): 3 [IU] via SUBCUTANEOUS

## 2024-01-30 NOTE — TOC Initial Note (Addendum)
 Transition of Care (TOC) - Initial/Assessment Note   Spoke to patient at bedside.   Patient from home with brother.   Prior to admission independent , does not use any DME , not on oxygen at home. Currently on room air.   Patient's PCP Dr Norleen Chancy has retired. Patient states current PCP is at same Sport and exercise psychologist Medical at [facility] , however he forgets providers name .   NCM called Sales promotion account executive Medical at [facility] [phone] , patient's PCP is [name] Hesselbach PA , updated face sheet    Patient Details  Name: Larry Dickson MRN: [id number] Date of Birth: 27-Nov-1960  Transition [facility]) CM/SW Contact:    Stephane Powell Jansky, RN Phone Number: 01/30/2024, 2:28 PM  Clinical Narrative:                   Expected Discharge Plan: Home/Self Care Barriers to Discharge: Continued Medical Work up   Patient Goals and CMS Choice Patient states their goals for this hospitalization and ongoing recovery are:: to return to home   Choice offered to / list presented to : NA      Expected Discharge Plan and Services In-house Referral: NA Discharge Planning Services: CM Consult Post Acute Care Choice: NA Living arrangements for the past 2 months: Single Family Home                 DME Arranged: N/A DME Agency: NA       HH Arranged: NA HH Agency: NA        Prior Living Arrangements/Services Living arrangements for the past 2 months: Single Family Home Lives with:: Siblings Patient language and need for interpreter reviewed:: Yes Do you feel safe going back to the place where you live?: Yes      Need for Family Participation in Patient Care: No (Comment) Care giver support system in place?: Yes (comment)   Criminal Activity/Legal Involvement Pertinent to Current Situation/Hospitalization: No - Comment as needed  Activities of Daily Living   ADL Screening (condition at time of admission) Independently performs ADLs?: Yes (appropriate for developmental  age) Is the patient deaf or have difficulty hearing?: No Does the patient have difficulty seeing, even when wearing glasses/contacts?: No Does the patient have difficulty concentrating, remembering, or making decisions?: No  Permission Sought/Granted Permission sought to share information with : PCP       Permission granted to share info w AGENCY: Sales promotion account executive Medical at [employer] Assessment Appearance:: Appears stated age Attitude/Demeanor/Rapport: Engaged Affect (typically observed): Appropriate Orientation: : Oriented to Self, Oriented to Place, Oriented to  Time, Oriented to Situation Alcohol / Substance Use: Not Applicable Psych Involvement: No (comment)  Admission diagnosis:  Shortness of breath [R06.02] Hypoxia [R09.02] Acute respiratory failure with hypoxia (HCC) [J96.01] Hypervolemia, unspecified hypervolemia type [E87.70] Patient Active Problem List   Diagnosis Date Noted   Acute CHF (congestive heart failure) (HCC) 01/28/2024   Asthma 01/28/2024   Acute respiratory failure with hypoxia (HCC) 11/26/2023   CKD (chronic kidney disease), stage IV (HCC) 11/26/2023   HIV (human immunodeficiency virus infection) (HCC) 07/14/2022   Hypertension associated with diabetes (HCC) 07/14/2022   OSA (obstructive sleep apnea) 07/14/2022   Hyperlipidemia associated with type 2 diabetes mellitus (HCC) 07/14/2022   Mild intermittent asthma with acute exacerbation 07/14/2022   Drusen of macula, 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   Nausea 07/06/2022   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, with long-term current use of insulin  (HCC) 07/06/2022   Chronic pain syndrome 03/25/2013   Proteinuria 03/25/2013   Insomnia 09/15/2011   PCP:  Beverley Norleen NOVAK, MD Pharmacy:   [facility] [phone] - [place], [place] - [address] [place] [address] Phone: [phone] Fax: [phone]     Social Drivers of Health (SDOH) Social History: SDOH Screenings   Food Insecurity: No Food Insecurity (01/28/2024)  Housing: Unknown (01/28/2024)  Transportation Needs: No Transportation Needs (01/28/2024)  Utilities: Not At Risk (01/28/2024)  Tobacco Use: Unknown (01/28/2024)   SDOH Interventions:     Readmission Risk Interventions    11/27/2023   12:48 PM  Readmission Risk Prevention Plan  Transportation Screening Complete  PCP or Specialist Appt within 5-7 Days Complete  Home Care Screening Complete  Medication Review (RN CM) Complete

## 2024-01-30 NOTE — Plan of Care (Signed)

## 2024-01-30 NOTE — Plan of Care (Signed)
 Problem: Education: Goal: Knowledge of General Education information will improve Description: Including pain rating scale, medication(s)/side effects and non-pharmacologic comfort measures 01/30/2024 0130 by Lennie Rodgers BIRCH, RN Outcome: Progressing 01/30/2024 0114 by Lennie Rodgers BIRCH, RN Outcome: Progressing   Problem: Health Behavior/Discharge Planning: Goal: Ability to manage health-related needs will improve 01/30/2024 0130 by Lennie Rodgers BIRCH, RN Outcome: Progressing 01/30/2024 0114 by Lennie Rodgers BIRCH, RN Outcome: Progressing   Problem: Clinical Measurements: Goal: Ability to maintain clinical measurements within normal limits will improve 01/30/2024 0130 by Lennie Rodgers BIRCH, RN Outcome: Progressing 01/30/2024 0114 by Lennie Rodgers BIRCH, RN Outcome: Progressing Goal: Will remain free from infection 01/30/2024 0130 by Lennie Rodgers BIRCH, RN Outcome: Progressing 01/30/2024 0114 by Lennie Rodgers BIRCH, RN Outcome: Progressing Goal: Diagnostic test results will improve 01/30/2024 0130 by Lennie Rodgers BIRCH, RN Outcome: Progressing 01/30/2024 0114 by Lennie Rodgers BIRCH, RN Outcome: Progressing Goal: Respiratory complications will improve 01/30/2024 0130 by Lennie Rodgers BIRCH, RN Outcome: Progressing 01/30/2024 0114 by Lennie Rodgers BIRCH, RN Outcome: Progressing Goal: Cardiovascular complication will be avoided 01/30/2024 0130 by Lennie Rodgers BIRCH, RN Outcome: Progressing 01/30/2024 0114 by Lennie Rodgers BIRCH, RN Outcome: Progressing   Problem: Activity: Goal: Risk for activity intolerance will decrease 01/30/2024 0130 by Lennie Rodgers BIRCH, RN Outcome: Progressing 01/30/2024 0114 by Lennie Rodgers BIRCH, RN Outcome: Progressing   Problem: Nutrition: Goal: Adequate nutrition will be maintained 01/30/2024 0130 by Lennie Rodgers BIRCH, RN Outcome: Progressing 01/30/2024 0114 by Lennie Rodgers BIRCH, RN Outcome: Progressing   Problem: Coping: Goal: Level of anxiety will decrease 01/30/2024 0130 by Lennie Rodgers BIRCH, RN Outcome: Progressing 01/30/2024 0114 by Lennie Rodgers BIRCH, RN Outcome: Progressing   Problem: Elimination: Goal: Will not experience complications related to bowel motility 01/30/2024 0130 by Lennie Rodgers BIRCH, RN Outcome: Progressing 01/30/2024 0114 by Lennie Rodgers BIRCH, RN Outcome: Progressing Goal: Will not experience complications related to urinary retention 01/30/2024 0130 by Lennie Rodgers BIRCH, RN Outcome: Progressing 01/30/2024 0114 by Lennie Rodgers BIRCH, RN Outcome: Progressing   Problem: Pain Managment: Goal: General experience of comfort will improve and/or be controlled 01/30/2024 0130 by Lennie Rodgers BIRCH, RN Outcome: Progressing 01/30/2024 0114 by Lennie Rodgers BIRCH, RN Outcome: Progressing   Problem: Safety: Goal: Ability to remain free from injury will improve 01/30/2024 0130 by Lennie Rodgers BIRCH, RN Outcome: Progressing 01/30/2024 0114 by Lennie Rodgers BIRCH, RN Outcome: Progressing   Problem: Skin Integrity: Goal: Risk for impaired skin integrity will decrease 01/30/2024 0130 by Lennie Rodgers BIRCH, RN Outcome: Progressing 01/30/2024 0114 by Lennie Rodgers BIRCH, RN Outcome: Progressing   Problem: Education: Goal: Ability to describe self-care measures that may prevent or decrease complications (Diabetes Survival Skills Education) will improve 01/30/2024 0130 by Lennie Rodgers BIRCH, RN Outcome: Progressing 01/30/2024 0114 by Lennie Rodgers BIRCH, RN Outcome: Progressing Goal: Individualized Educational Video(s) 01/30/2024 0130 by Lennie Rodgers BIRCH, RN Outcome: Progressing 01/30/2024 0114 by Lennie Rodgers BIRCH, RN Outcome: Progressing   Problem: Coping: Goal: Ability to adjust to condition or change in health will improve 01/30/2024 0130 by Lennie Rodgers BIRCH, RN Outcome: Progressing 01/30/2024 0114 by Lennie Rodgers BIRCH, RN Outcome: Progressing   Problem: Fluid Volume: Goal: Ability to maintain a balanced intake and output will improve 01/30/2024 0130 by Lennie Rodgers BIRCH,  RN Outcome: Progressing 01/30/2024 0114 by Lennie Rodgers BIRCH, RN Outcome: Progressing   Problem: Health Behavior/Discharge Planning: Goal: Ability to identify and utilize available resources and services will improve 01/30/2024 0130 by Lennie Rodgers BIRCH, RN Outcome: Progressing 01/30/2024 0114 by  Lennie Rodgers BIRCH, RN Outcome: Progressing Goal: Ability to manage health-related needs will improve 01/30/2024 0130 by Lennie Rodgers BIRCH, RN Outcome: Progressing 01/30/2024 0114 by Lennie Rodgers BIRCH, RN Outcome: Progressing   Problem: Metabolic: Goal: Ability to maintain appropriate glucose levels will improve 01/30/2024 0130 by Lennie Rodgers BIRCH, RN Outcome: Progressing 01/30/2024 0114 by Lennie Rodgers BIRCH, RN Outcome: Progressing   Problem: Nutritional: Goal: Maintenance of adequate nutrition will improve 01/30/2024 0130 by Lennie Rodgers BIRCH, RN Outcome: Progressing 01/30/2024 0114 by Lennie Rodgers BIRCH, RN Outcome: Progressing Goal: Progress toward achieving an optimal weight will improve 01/30/2024 0130 by Lennie Rodgers BIRCH, RN Outcome: Progressing 01/30/2024 0114 by Lennie Rodgers BIRCH, RN Outcome: Progressing   Problem: Skin Integrity: Goal: Risk for impaired skin integrity will decrease 01/30/2024 0130 by Lennie Rodgers BIRCH, RN Outcome: Progressing 01/30/2024 0114 by Lennie Rodgers BIRCH, RN Outcome: Progressing   Problem: Tissue Perfusion: Goal: Adequacy of tissue perfusion will improve 01/30/2024 0130 by Lennie Rodgers BIRCH, RN Outcome: Progressing 01/30/2024 0114 by Lennie Rodgers BIRCH, RN Outcome: Progressing   Problem: Activity: Goal: Ability to tolerate increased activity will improve 01/30/2024 0130 by Lennie Rodgers BIRCH, RN Outcome: Progressing 01/30/2024 0114 by Lennie Rodgers BIRCH, RN Outcome: Progressing   Problem: Clinical Measurements: Goal: Ability to maintain a body temperature in the normal range will improve 01/30/2024 0130 by Lennie Rodgers BIRCH, RN Outcome: Progressing 01/30/2024 0114 by  Lennie Rodgers BIRCH, RN Outcome: Progressing   Problem: Respiratory: Goal: Ability to maintain adequate ventilation will improve 01/30/2024 0130 by Lennie Rodgers BIRCH, RN Outcome: Progressing 01/30/2024 0114 by Lennie Rodgers BIRCH, RN Outcome: Progressing Goal: Ability to maintain a clear airway will improve 01/30/2024 0130 by Lennie Rodgers BIRCH, RN Outcome: Progressing 01/30/2024 0114 by Lennie Rodgers BIRCH, RN Outcome: Progressing

## 2024-01-30 NOTE — Plan of Care (Signed)
 Problem: Education: Goal: Knowledge of General Education information will improve Description: Including pain rating scale, medication(s)/side effects and non-pharmacologic comfort measures Outcome: Progressing   Problem: Health Behavior/Discharge Planning: Goal: Ability to manage health-related needs will improve Outcome: Progressing   Problem: Clinical Measurements: Goal: Ability to maintain clinical measurements within normal limits will improve Outcome: Progressing Goal: Will remain free from infection Outcome: Progressing Goal: Diagnostic test results will improve Outcome: Progressing Goal: Respiratory complications will improve Outcome: Progressing Goal: Cardiovascular complication will be avoided Outcome: Progressing   Problem: Activity: Goal: Risk for activity intolerance will decrease Outcome: Progressing   Problem: Nutrition: Goal: Adequate nutrition will be maintained Outcome: Progressing   Problem: Coping: Goal: Level of anxiety will decrease Outcome: Progressing   Problem: Elimination: Goal: Will not experience complications related to bowel motility Outcome: Progressing Goal: Will not experience complications related to urinary retention Outcome: Progressing   Problem: Pain Managment: Goal: General experience of comfort will improve and/or be controlled Outcome: Progressing   Problem: Safety: Goal: Ability to remain free from injury will improve Outcome: Progressing   Problem: Skin Integrity: Goal: Risk for impaired skin integrity will decrease Outcome: Progressing   Problem: Education: Goal: Ability to describe self-care measures that may prevent or decrease complications (Diabetes Survival Skills Education) will improve Outcome: Progressing Goal: Individualized Educational Video(s) Outcome: Progressing   Problem: Coping: Goal: Ability to adjust to condition or change in health will improve Outcome: Progressing   Problem: Fluid  Volume: Goal: Ability to maintain a balanced intake and output will improve Outcome: Progressing   Problem: Health Behavior/Discharge Planning: Goal: Ability to identify and utilize available resources and services will improve Outcome: Progressing Goal: Ability to manage health-related needs will improve Outcome: Progressing   Problem: Metabolic: Goal: Ability to maintain appropriate glucose levels will improve Outcome: Progressing   Problem: Nutritional: Goal: Maintenance of adequate nutrition will improve Outcome: Progressing Goal: Progress toward achieving an optimal weight will improve Outcome: Progressing   Problem: Skin Integrity: Goal: Risk for impaired skin integrity will decrease Outcome: Progressing   Problem: Tissue Perfusion: Goal: Adequacy of tissue perfusion will improve Outcome: Progressing   Problem: Activity: Goal: Ability to tolerate increased activity will improve Outcome: Progressing   Problem: Clinical Measurements: Goal: Ability to maintain a body temperature in the normal range will improve Outcome: Progressing   Problem: Respiratory: Goal: Ability to maintain adequate ventilation will improve Outcome: Progressing Goal: Ability to maintain a clear airway will improve Outcome: Progressing   Problem: Education: Goal: Knowledge of General Education information will improve Description: Including pain rating scale, medication(s)/side effects and non-pharmacologic comfort measures Outcome: Progressing   Problem: Health Behavior/Discharge Planning: Goal: Ability to manage health-related needs will improve Outcome: Progressing   Problem: Clinical Measurements: Goal: Ability to maintain clinical measurements within normal limits will improve Outcome: Progressing Goal: Will remain free from infection Outcome: Progressing Goal: Diagnostic test results will improve Outcome: Progressing Goal: Respiratory complications will improve Outcome:  Progressing Goal: Cardiovascular complication will be avoided Outcome: Progressing   Problem: Activity: Goal: Risk for activity intolerance will decrease Outcome: Progressing   Problem: Nutrition: Goal: Adequate nutrition will be maintained Outcome: Progressing   Problem: Coping: Goal: Level of anxiety will decrease Outcome: Progressing   Problem: Elimination: Goal: Will not experience complications related to bowel motility Outcome: Progressing Goal: Will not experience complications related to urinary retention Outcome: Progressing   Problem: Pain Managment: Goal: General experience of comfort will improve and/or be controlled Outcome: Progressing   Problem: Safety: Goal: Ability to  remain free from injury will improve Outcome: Progressing   Problem: Skin Integrity: Goal: Risk for impaired skin integrity will decrease Outcome: Progressing   Problem: Education: Goal: Ability to describe self-care measures that may prevent or decrease complications (Diabetes Survival Skills Education) will improve Outcome: Progressing Goal: Individualized Educational Video(s) Outcome: Progressing   Problem: Coping: Goal: Ability to adjust to condition or change in health will improve Outcome: Progressing   Problem: Fluid Volume: Goal: Ability to maintain a balanced intake and output will improve Outcome: Progressing   Problem: Health Behavior/Discharge Planning: Goal: Ability to identify and utilize available resources and services will improve Outcome: Progressing Goal: Ability to manage health-related needs will improve Outcome: Progressing   Problem: Metabolic: Goal: Ability to maintain appropriate glucose levels will improve Outcome: Progressing   Problem: Nutritional: Goal: Maintenance of adequate nutrition will improve Outcome: Progressing Goal: Progress toward achieving an optimal weight will improve Outcome: Progressing   Problem: Skin Integrity: Goal: Risk for  impaired skin integrity will decrease Outcome: Progressing   Problem: Tissue Perfusion: Goal: Adequacy of tissue perfusion will improve Outcome: Progressing   Problem: Activity: Goal: Ability to tolerate increased activity will improve Outcome: Progressing   Problem: Clinical Measurements: Goal: Ability to maintain a body temperature in the normal range will improve Outcome: Progressing   Problem: Respiratory: Goal: Ability to maintain adequate ventilation will improve Outcome: Progressing Goal: Ability to maintain a clear airway will improve Outcome: Progressing   Problem: Education: Goal: Knowledge of General Education information will improve Description: Including pain rating scale, medication(s)/side effects and non-pharmacologic comfort measures Outcome: Progressing   Problem: Health Behavior/Discharge Planning: Goal: Ability to manage health-related needs will improve Outcome: Progressing   Problem: Clinical Measurements: Goal: Ability to maintain clinical measurements within normal limits will improve Outcome: Progressing Goal: Will remain free from infection Outcome: Progressing Goal: Diagnostic test results will improve Outcome: Progressing Goal: Respiratory complications will improve Outcome: Progressing Goal: Cardiovascular complication will be avoided Outcome: Progressing   Problem: Activity: Goal: Risk for activity intolerance will decrease Outcome: Progressing   Problem: Nutrition: Goal: Adequate nutrition will be maintained Outcome: Progressing   Problem: Coping: Goal: Level of anxiety will decrease Outcome: Progressing   Problem: Elimination: Goal: Will not experience complications related to bowel motility Outcome: Progressing Goal: Will not experience complications related to urinary retention Outcome: Progressing   Problem: Pain Managment: Goal: General experience of comfort will improve and/or be controlled Outcome: Progressing    Problem: Safety: Goal: Ability to remain free from injury will improve Outcome: Progressing   Problem: Skin Integrity: Goal: Risk for impaired skin integrity will decrease Outcome: Progressing   Problem: Education: Goal: Ability to describe self-care measures that may prevent or decrease complications (Diabetes Survival Skills Education) will improve Outcome: Progressing Goal: Individualized Educational Video(s) Outcome: Progressing   Problem: Coping: Goal: Ability to adjust to condition or change in health will improve Outcome: Progressing   Problem: Fluid Volume: Goal: Ability to maintain a balanced intake and output will improve Outcome: Progressing   Problem: Health Behavior/Discharge Planning: Goal: Ability to identify and utilize available resources and services will improve Outcome: Progressing Goal: Ability to manage health-related needs will improve Outcome: Progressing   Problem: Metabolic: Goal: Ability to maintain appropriate glucose levels will improve Outcome: Progressing   Problem: Nutritional: Goal: Maintenance of adequate nutrition will improve Outcome: Progressing Goal: Progress toward achieving an optimal weight will improve Outcome: Progressing   Problem: Skin Integrity: Goal: Risk for impaired skin integrity will decrease Outcome: Progressing  Problem: Tissue Perfusion: Goal: Adequacy of tissue perfusion will improve Outcome: Progressing   Problem: Activity: Goal: Ability to tolerate increased activity will improve Outcome: Progressing   Problem: Clinical Measurements: Goal: Ability to maintain a body temperature in the normal range will improve Outcome: Progressing   Problem: Respiratory: Goal: Ability to maintain adequate ventilation will improve Outcome: Progressing Goal: Ability to maintain a clear airway will improve Outcome: Progressing   Problem: Education: Goal: Knowledge of General Education information will  improve Description: Including pain rating scale, medication(s)/side effects and non-pharmacologic comfort measures Outcome: Progressing   Problem: Health Behavior/Discharge Planning: Goal: Ability to manage health-related needs will improve Outcome: Progressing   Problem: Clinical Measurements: Goal: Ability to maintain clinical measurements within normal limits will improve Outcome: Progressing Goal: Will remain free from infection Outcome: Progressing Goal: Diagnostic test results will improve Outcome: Progressing Goal: Respiratory complications will improve Outcome: Progressing Goal: Cardiovascular complication will be avoided Outcome: Progressing   Problem: Activity: Goal: Risk for activity intolerance will decrease Outcome: Progressing   Problem: Nutrition: Goal: Adequate nutrition will be maintained Outcome: Progressing   Problem: Coping: Goal: Level of anxiety will decrease Outcome: Progressing   Problem: Elimination: Goal: Will not experience complications related to bowel motility Outcome: Progressing Goal: Will not experience complications related to urinary retention Outcome: Progressing   Problem: Pain Managment: Goal: General experience of comfort will improve and/or be controlled Outcome: Progressing   Problem: Safety: Goal: Ability to remain free from injury will improve Outcome: Progressing   Problem: Skin Integrity: Goal: Risk for impaired skin integrity will decrease Outcome: Progressing   Problem: Education: Goal: Ability to describe self-care measures that may prevent or decrease complications (Diabetes Survival Skills Education) will improve Outcome: Progressing Goal: Individualized Educational Video(s) Outcome: Progressing   Problem: Coping: Goal: Ability to adjust to condition or change in health will improve Outcome: Progressing   Problem: Fluid Volume: Goal: Ability to maintain a balanced intake and output will improve Outcome:  Progressing   Problem: Health Behavior/Discharge Planning: Goal: Ability to identify and utilize available resources and services will improve Outcome: Progressing Goal: Ability to manage health-related needs will improve Outcome: Progressing   Problem: Metabolic: Goal: Ability to maintain appropriate glucose levels will improve Outcome: Progressing   Problem: Nutritional: Goal: Maintenance of adequate nutrition will improve Outcome: Progressing Goal: Progress toward achieving an optimal weight will improve Outcome: Progressing   Problem: Skin Integrity: Goal: Risk for impaired skin integrity will decrease Outcome: Progressing   Problem: Tissue Perfusion: Goal: Adequacy of tissue perfusion will improve Outcome: Progressing   Problem: Activity: Goal: Ability to tolerate increased activity will improve Outcome: Progressing   Problem: Clinical Measurements: Goal: Ability to maintain a body temperature in the normal range will improve Outcome: Progressing   Problem: Respiratory: Goal: Ability to maintain adequate ventilation will improve Outcome: Progressing Goal: Ability to maintain a clear airway will improve Outcome: Progressing   Problem: Education: Goal: Knowledge of General Education information will improve Description: Including pain rating scale, medication(s)/side effects and non-pharmacologic comfort measures Outcome: Progressing   Problem: Health Behavior/Discharge Planning: Goal: Ability to manage health-related needs will improve Outcome: Progressing   Problem: Clinical Measurements: Goal: Ability to maintain clinical measurements within normal limits will improve Outcome: Progressing Goal: Will remain free from infection Outcome: Progressing Goal: Diagnostic test results will improve Outcome: Progressing Goal: Respiratory complications will improve Outcome: Progressing Goal: Cardiovascular complication will be avoided Outcome: Progressing    Problem: Activity: Goal: Risk for activity intolerance will decrease Outcome: Progressing  Problem: Nutrition: Goal: Adequate nutrition will be maintained Outcome: Progressing   Problem: Coping: Goal: Level of anxiety will decrease Outcome: Progressing   Problem: Elimination: Goal: Will not experience complications related to bowel motility Outcome: Progressing Goal: Will not experience complications related to urinary retention Outcome: Progressing   Problem: Pain Managment: Goal: General experience of comfort will improve and/or be controlled Outcome: Progressing   Problem: Safety: Goal: Ability to remain free from injury will improve Outcome: Progressing   Problem: Skin Integrity: Goal: Risk for impaired skin integrity will decrease Outcome: Progressing   Problem: Education: Goal: Ability to describe self-care measures that may prevent or decrease complications (Diabetes Survival Skills Education) will improve Outcome: Progressing Goal: Individualized Educational Video(s) Outcome: Progressing   Problem: Coping: Goal: Ability to adjust to condition or change in health will improve Outcome: Progressing   Problem: Fluid Volume: Goal: Ability to maintain a balanced intake and output will improve Outcome: Progressing   Problem: Health Behavior/Discharge Planning: Goal: Ability to identify and utilize available resources and services will improve Outcome: Progressing Goal: Ability to manage health-related needs will improve Outcome: Progressing   Problem: Metabolic: Goal: Ability to maintain appropriate glucose levels will improve Outcome: Progressing   Problem: Nutritional: Goal: Maintenance of adequate nutrition will improve Outcome: Progressing Goal: Progress toward achieving an optimal weight will improve Outcome: Progressing   Problem: Skin Integrity: Goal: Risk for impaired skin integrity will decrease Outcome: Progressing   Problem: Tissue  Perfusion: Goal: Adequacy of tissue perfusion will improve Outcome: Progressing   Problem: Activity: Goal: Ability to tolerate increased activity will improve Outcome: Progressing   Problem: Clinical Measurements: Goal: Ability to maintain a body temperature in the normal range will improve Outcome: Progressing   Problem: Respiratory: Goal: Ability to maintain adequate ventilation will improve Outcome: Progressing Goal: Ability to maintain a clear airway will improve Outcome: Progressing

## 2024-01-30 NOTE — Progress Notes (Signed)
 [facility] Progress Note Patient: Larry Dickson FMW:[id number] DOB: 1960-07-10  DOA: 01/28/2024 DOS: the patient was seen and examined on 01/30/2024  Brief Hospital Course:  Larry Dickson is a 63 y.o. male with history of HIV, chronic kidney disease stage IV, diabetes mellitus type 2, hypertension, asthma, sleep apnea, CHF presents to the ER because of shortness of breath.  Patient states over the last 2 days he has been getting short of breath with some fluid in the lower extremity.  Patient states he has been compliant with his torsemide .  Denies any chest pain productive cough fever or chills.  Due to persistent symptoms patient presents to the ER at [facility].  Currently being treated for pneumonia. Assessment and Plan: Acute respiratory failure with hypoxia  Due to community-acquired pneumonia. Required 3 LPM in the ED. Currently oxygenation improving.  94% on room air both exertion as well as at rest. Appears euvolemic. Suspect secondary to pneumonia rather than heart failure. Continue with IV antibiotic.  Monitor for culture clearance.  Concern for acute diastolic CHF  Echocardiogram shows EF 65% grade 2 diastolic dysfunction. Renal function worsening after diuresis. Currently holding diuresis. Will monitor for now. Volume status appears to be adequate for now.  Hypertension uncontrolled  likely contributing to patient's symptoms.  Patient is on ARB Coreg  amlodipine  and presently on IV Lasix .  Follow blood pressure trends.  Appears to be improving.  Asthma  presently not wheezing continue Breo Ellipta .  HIV  last CD4 count was 747 in May 2025.  Continue Triumeq . Patient reports compliance.  Diabetes mellitus type II  takes Lantus  insulin  52 units twice daily along with Mounjaro.  Presently on sliding scale coverage and 50 units twice daily.  Last hemoglobin A1c was 5.2  2 months ago.  Chronic kidney disease stage IV  Baseline creatinine around 2. On admission  serum creatinine was 2.3. Worsened to 2.79 today after diuresis. Currently holding diuresis monitor  Chronic anemia  likely from renal disease follow CBC.  Sleep apnea  on CPAP at bedtime.  Hyperlipidemia  on statins and fenofibrate .    Subjective: No nausea no vomiting no fever no chills.  Breathing better.  Physical Exam: Basilar crackles. S1-S2 present. No edema. Bowel sound present.  Data Reviewed: I have Reviewed nursing notes, Vitals, and Lab results. Since last encounter, pertinent lab results CBC and BMP   . I have ordered test including CBC and BMP  .   Disposition: Status is: Inpatient Remains inpatient appropriate because: Monitor for culture clearance Family Communication: No one at bedside Level of care: Telemetry Medical continue Vitals:   01/30/24 0500 01/30/24 0829 01/30/24 0854 01/30/24 1650  BP: (!) 140/83  (!) 146/87 131/78  Pulse: 78  80 76  Resp: 18   17  Temp: 97.9 F (36.6 C)  97.6 F (36.4 C) 97.7 F (36.5 C)  TempSrc: Oral  Oral Oral  SpO2: 94% 96% 97% 98%  Weight: 108.7 kg     Height:         Author: Yetta Blanch, MD 01/30/2024 7:04 PM  Please look on [email or web address] to find out who is on call.

## 2024-01-30 NOTE — Inpatient Diabetes Management (Signed)
 Inpatient Diabetes Program Recommendations  AACE/ADA: New Consensus Statement on Inpatient Glycemic Control (2015)  Target Ranges:  Prepandial:   less than 140 mg/dL      Peak postprandial:   less than 180 mg/dL (1-2 hours)      Critically ill patients:  140 - 180 mg/dL   Lab Results  Component Value Date   GLUCAP 408 (H) 01/30/2024   HGBA1C 5.2 11/27/2023    Latest Reference Range & Units 01/28/24 23:39 01/29/24 08:43 01/29/24 12:43 01/29/24 17:04 01/29/24 21:14 01/30/24 08:56  Glucose-Capillary 70 - 99 mg/dL 670 (H) 625 (H) 718 (H) 343 (H) 339 (H) 408 (H)  (H): Data is abnormally high  Diabetes history: DM2 Outpatient Diabetes medications: Lantus  60 units bid, Mounjaro 5 mg weekly Current orders for Inpatient glycemic control: Lantus  50 units bid Novolog  3 units tid meal coverage Novolog  0-20 units tid, 0-5 units hs   Inpatient Diabetes Program Recommendations:   Please consider: -Increase Lantus  to 60 units bid (home basal dose) Patient did not receive Novolog  correction for am 408. Will recheck @ noon.  Thank you, Emrys Mckamie E. Adeena Bernabe, RN, MSN, CDCES  Diabetes Coordinator Inpatient Glycemic Control Team Team Pager [phone] (8am-5pm) 01/30/2024 10:41 AM

## 2024-01-31 ENCOUNTER — Other Ambulatory Visit (HOSPITAL_COMMUNITY): Payer: Self-pay

## 2024-01-31 DIAGNOSIS — J9601 Acute respiratory failure with hypoxia: Secondary | ICD-10-CM | POA: Diagnosis not present

## 2024-01-31 LAB — CBC
HCT: 33.6 % — ABNORMAL LOW (ref 39.0–52.0)
Hemoglobin: 10.9 g/dL — ABNORMAL LOW (ref 13.0–17.0)
MCH: 29.3 pg (ref 26.0–34.0)
MCHC: 32.4 g/dL (ref 30.0–36.0)
MCV: 90.3 fL (ref 80.0–100.0)
Platelets: 258 K/uL (ref 150–400)
RBC: 3.72 MIL/uL — ABNORMAL LOW (ref 4.22–5.81)
RDW: 13.9 % (ref 11.5–15.5)
WBC: 16.1 K/uL — ABNORMAL HIGH (ref 4.0–10.5)
nRBC: 0.3 % — ABNORMAL HIGH (ref 0.0–0.2)

## 2024-01-31 LAB — RENAL FUNCTION PANEL
Albumin: 2.6 g/dL — ABNORMAL LOW (ref 3.5–5.0)
Anion gap: 8 (ref 5–15)
BUN: 41 mg/dL — ABNORMAL HIGH (ref 8–23)
CO2: 23 mmol/L (ref 22–32)
Calcium: 8.2 mg/dL — ABNORMAL LOW (ref 8.9–10.3)
Chloride: 104 mmol/L (ref 98–111)
Creatinine, Ser: 2.79 mg/dL — ABNORMAL HIGH (ref 0.61–1.24)
GFR, Estimated: 25 mL/min — ABNORMAL LOW (ref >60)
Glucose, Bld: 93 mg/dL (ref 70–99)
Phosphorus: 4 mg/dL (ref 2.5–4.6)
Potassium: 3.1 mmol/L — ABNORMAL LOW (ref 3.5–5.1)
Sodium: 135 mmol/L (ref 135–145)

## 2024-01-31 LAB — GLUCOSE, CAPILLARY
Glucose-Capillary: 171 mg/dL — ABNORMAL HIGH (ref 70–99)
Glucose-Capillary: 203 mg/dL — ABNORMAL HIGH (ref 70–99)

## 2024-01-31 LAB — MAGNESIUM: Magnesium: 2.3 mg/dL (ref 1.7–2.4)

## 2024-01-31 MED ORDER — ISOSORB DINITRATE-HYDRALAZINE 20-37.5 MG PO TABS
1.0000 | ORAL_TABLET | Freq: Three times a day (TID) | ORAL | 0 refills | Status: AC
  Filled 2024-01-31: qty 180, 60d supply, fill #0

## 2024-01-31 MED ORDER — CEFUROXIME AXETIL 500 MG PO TABS
500.0000 mg | ORAL_TABLET | Freq: Two times a day (BID) | ORAL | 0 refills | Status: AC
  Filled 2024-01-31: qty 8, 4d supply, fill #0

## 2024-01-31 MED ORDER — AZITHROMYCIN 250 MG PO TABS
250.0000 mg | ORAL_TABLET | Freq: Every day | ORAL | 0 refills | Status: AC
  Filled 2024-01-31: qty 2, 2d supply, fill #0

## 2024-01-31 NOTE — Progress Notes (Signed)
 Explained discharge instructions to patient. Reviewed follow up appointment and next medication administration times. Also reviewed education. Patient verbalized having an understanding for instructions given. All belongings are in the patient's possession to include TOC meds. IV removed. Telemetry monitor was removed prior to my entering to complete the discharge.  No other needs verbalized. Transported downstairs for discharge.

## 2024-01-31 NOTE — Care Management Important Message (Signed)
 Important Message  Patient Details  Name: Larry Dickson MRN: [id number] Date of Birth: Mar 01, 1961   Important Message Given:  Yes - Medicare IM     Jon Cruel 01/31/2024, 4:50 PM

## 2024-01-31 NOTE — Discharge Summary (Signed)
 DISCHARGE SUMMARY  Larry Dickson  MR#: [id number]  DOB:06-Dec-1960  Date of Admission: 01/28/2024 Date of Discharge: 01/31/2024  Attending Physician:Windsor Goeken ONEIDA Moores, MD  Patient's ERE:[name], [name], PA-C  Disposition: D/C home   Follow-up Appts:  Follow-up Information     [name], PA-C Follow up in 1 week(s).   Specialty: Internal Medicine Contact information: [address] [place] [address] [phone]                 Discharge Diagnoses: Community-acquired pneumonia Acute hypoxic respiratory failure Chronic diastolic CHF Uncontrolled hypertension Asthma w/o acute exacerbation  HIV DM2 CKD stage IV Chronic anemia of CKD Sleep apnea HLD  Initial presentation: 63 year old with a history of HIV, CKD stage IV, DM2, HTN, asthma, sleep apnea, and diastolic CHF who presented to the ER 8/24 with shortness of breath that had been worsening over a 2-day timeframe and associated with lower extremity edema.   Hospital Course:  Community-acquired pneumonia -acute hypoxic respiratory failure Requiring 3 L nasal cannula support on presentation with associated dyspnea - during course of hospitalization oxygenation has improved with patient on room air with sats at 92% or > at time of d/c - to complete 7 day course of abx via oral route at time of d/c    Chronic diastolic CHF No clinical evidence for an acute exacerbation despite concerns of this at presentation -TTE noted EF 65% with grade 2 DD - renal function did not tolerate attempts to diurese   Uncontrolled hypertension Blood pressure controlled with resumption of home medications during this hospital stay   Asthma Well compensated on home medications   HIV Continue usual home medication - patient reports compliance   DM2 Utilizes Lantus  52 units twice daily with Mounjaro at home - A1c 5.22 months ago - no change to usual home regimen    CKD stage IV Baseline creatinine  approximately 2.0 - creatinine worsened to 2.79 after diuresis - pt informs me that his PCP has referred him to a Nephrologist to be seen - I have encouraged him to f/u on this issue, and to make sure he attends the appointment when it is arranged - I have explained to him that he will need to see his PCP in the next 7-10 days to have a recheck of his creatinine    Chronic anemia of CKD Hgb stable during this admit   Sleep apnea Continue nightly CPAP - encouraged compliance    HLD Continue statin and fenofibrate   Allergies as of 01/31/2024       Reactions   Dapsone    Other Reaction(s): Other (See Comments) G6PD deficiency   Primaquine    Other Reaction(s): Other (See Comments) G6PD deficiency        Medication List     STOP taking these medications    telmisartan 80 MG tablet Commonly known as: MICARDIS       TAKE these medications    albuterol  108 (90 Base) MCG/ACT inhaler Commonly known as: VENTOLIN  HFA Inhale 1-2 puffs into the lungs every 6 (six) hours as needed for wheezing or shortness of breath.   albuterol  (5 MG/ML) 0.5% nebulizer solution Commonly known as: PROVENTIL  Take 2.5 mg by nebulization every 6 (six) hours as needed for wheezing or shortness of breath.   amLODipine  10 MG tablet Commonly known as: NORVASC  Take 10 mg by mouth daily.   azithromycin  250 MG tablet Commonly known as: ZITHROMAX  Take 1 tablet (250 mg total) by mouth daily. Start  taking on: February 01, 2024   Breo Ellipta  200-25 MCG/ACT Aepb Generic drug: fluticasone  furoate-vilanterol Inhale 1 puff into the lungs daily.   Breztri Aerosphere 160-9-4.8 MCG/ACT Aero inhaler Generic drug: budesonide -glycopyrrolate-formoterol Inhale 2 puffs into the lungs daily.   carvedilol  12.5 MG tablet Commonly known as: COREG  Take 1 tablet (12.5 mg total) by mouth 2 (two) times daily with a meal.   cefUROXime  500 MG tablet Commonly known as: CEFTIN  Take 1 tablet (500 mg total) by mouth 2  (two) times daily with a meal for 4 days.   esomeprazole 40 MG capsule Commonly known as: NEXIUM Take 40 mg by mouth daily.   fenofibrate  145 MG tablet Commonly known as: TRICOR  Take 145 mg by mouth daily.   fluticasone  50 MCG/ACT nasal spray Commonly known as: FLONASE  Place 1 spray into both nostrils daily as needed for allergies or rhinitis.   insulin  glargine 100 UNIT/ML injection Commonly known as: LANTUS  Inject 60 Units into the skin 2 (two) times daily.   isosorbide -hydrALAZINE  20-37.5 MG tablet Commonly known as: BIDIL  Take 1 tablet by mouth 3 (three) times daily.   Linzess  290 MCG Caps capsule Generic drug: linaclotide  Take 290 mcg by mouth every other day.   methocarbamol  500 MG tablet Commonly known as: ROBAXIN  Take 1 tablet (500 mg total) by mouth every 8 (eight) hours as needed. What changed: reasons to take this   Mounjaro 5 MG/0.5ML Pen Generic drug: tirzepatide Inject 5 mg into the skin once a week.   pravastatin  80 MG tablet Commonly known as: PRAVACHOL  Take 80 mg by mouth daily.   Symbicort 80-4.5 MCG/ACT inhaler Generic drug: budesonide -formoterol Inhale 2 puffs into the lungs daily.   torsemide  20 MG tablet Commonly known as: DEMADEX  Take 40 mg by mouth daily.   traMADol  50 MG tablet Commonly known as: ULTRAM  Take 1 tablet (50 mg total) by mouth every 6 (six) hours as needed. What changed: reasons to take this   Triumeq  600-50-300 MG tablet Generic drug: abacavir -dolutegravir -lamiVUDine  Take 1 tablet by mouth daily.   valACYclovir  500 MG tablet Commonly known as: VALTREX  Take 500 mg by mouth daily.        Day of Discharge BP (!) 153/94   Pulse 76   Temp 98 F (36.7 C) (Oral)   Resp 17   Ht 5' 8 (1.727 m)   Wt 112.6 kg   SpO2 97%   BMI 37.74 kg/m   Physical Exam: General: No acute respiratory distress Lungs: Clear to auscultation bilaterally without wheezes or crackles Cardiovascular: Regular rate and rhythm without  murmur gallop or rub normal S1 and S2 Abdomen: Nontender, nondistended, soft, bowel sounds positive, no rebound, no ascites, no appreciable mass Extremities: No significant cyanosis, clubbing, or edema bilateral lower extremities  Basic Metabolic Panel: Recent Labs  Lab 01/28/24 1334 01/28/24 1345 01/29/24 0017 01/29/24 0711 01/30/24 0239 01/31/24 0214  NA 139 139  --  135 134* 135  K 3.7 3.5  --  3.7 3.6 3.1*  CL 101  --   --  101 99 104  CO2 25  --   --  22 23 23   GLUCOSE 138*  --   --  237* 272* 93  BUN 17  --   --  31* 45* 41*  CREATININE 2.32*  --  2.86* 2.79* 2.86* 2.79*  CALCIUM 9.2  --   --  8.9 8.4* 8.2*  MG  --   --   --   --  2.3 2.3  PHOS  --   --   --   --  3.7 4.0    CBC: Recent Labs  Lab 01/28/24 1334 01/28/24 1345 01/29/24 0017 01/29/24 0711 01/30/24 0239 01/31/24 0214  WBC 22.7*  --  17.1* 21.6* 23.4* 16.1*  NEUTROABS 18.8*  --   --  19.3*  --   --   HGB 12.5* 13.9 12.0* 12.7* 10.8* 10.9*  HCT 38.9* 41.0 37.7* 39.6 33.6* 33.6*  MCV 89.4  --  90.0 89.6 90.1 90.3  PLT 265  --  244 233 245 258    Time spent in discharge (includes decision making & examination of pt): 35 minutes  01/31/2024, 2:25 PM   Reyes IVAR Moores, MD [facility] Office  [phone]

## 2024-01-31 NOTE — Plan of Care (Signed)
   Problem: Education: Goal: Knowledge of General Education information will improve Description: Including pain rating scale, medication(s)/side effects and non-pharmacologic comfort measures Outcome: Progressing   Problem: Activity: Goal: Risk for activity intolerance will decrease Outcome: Progressing   Problem: Nutrition: Goal: Adequate nutrition will be maintained Outcome: Progressing   Problem: Coping: Goal: Level of anxiety will decrease Outcome: Progressing

## 2024-01-31 NOTE — Plan of Care (Signed)
  Problem: Education: Goal: Knowledge of General Education information will improve Description: Including pain rating scale, medication(s)/side effects and non-pharmacologic comfort measures Outcome: Progressing   Problem: Health Behavior/Discharge Planning: Goal: Ability to manage health-related needs will improve Outcome: Progressing   Problem: Coping: Goal: Level of anxiety will decrease Outcome: Progressing   Problem: Education: Goal: Knowledge of General Education information will improve Description: Including pain rating scale, medication(s)/side effects and non-pharmacologic comfort measures Outcome: Progressing   Problem: Health Behavior/Discharge Planning: Goal: Ability to manage health-related needs will improve Outcome: Progressing   Problem: Clinical Measurements: Goal: Ability to maintain clinical measurements within normal limits will improve Outcome: Progressing   Problem: Coping: Goal: Level of anxiety will decrease Outcome: Progressing

## 2024-01-31 NOTE — Progress Notes (Signed)
 Patient c/o SOB, labored breathing noted.O2 stat 96, 1L of oxygen applied for comfort and DUONEB tx administered. Patient states feels better after tx but wheezing and labored breathing noted on respiratory assessment. Pt also refused his CPAP during the night. On call notified.

## 2024-02-03 LAB — CULTURE, BLOOD (ROUTINE X 2)
Culture: NO GROWTH
Culture: NO GROWTH
Special Requests: ADEQUATE
Special Requests: ADEQUATE
